# Patient Record
Sex: Male | Born: 1935 | Race: White | Hispanic: No | Marital: Married | Smoking: Never smoker
Health system: Southern US, Community
[De-identification: ages and names within clinical notes are randomized; demographics above are authoritative.]

## PROBLEM LIST (undated history)

## (undated) DIAGNOSIS — R972 Elevated prostate specific antigen [PSA]: Secondary | ICD-10-CM

## (undated) DIAGNOSIS — N419 Inflammatory disease of prostate, unspecified: Secondary | ICD-10-CM

## (undated) DIAGNOSIS — R945 Abnormal results of liver function studies: Secondary | ICD-10-CM

## (undated) DIAGNOSIS — M1990 Unspecified osteoarthritis, unspecified site: Secondary | ICD-10-CM

## (undated) DIAGNOSIS — E119 Type 2 diabetes mellitus without complications: Secondary | ICD-10-CM

## (undated) DIAGNOSIS — M1712 Unilateral primary osteoarthritis, left knee: Secondary | ICD-10-CM

## (undated) DIAGNOSIS — G459 Transient cerebral ischemic attack, unspecified: Secondary | ICD-10-CM

## (undated) DIAGNOSIS — K648 Other hemorrhoids: Secondary | ICD-10-CM

## (undated) DIAGNOSIS — I639 Cerebral infarction, unspecified: Secondary | ICD-10-CM

## (undated) DIAGNOSIS — I493 Ventricular premature depolarization: Secondary | ICD-10-CM

## (undated) DIAGNOSIS — K2970 Gastritis, unspecified, without bleeding: Secondary | ICD-10-CM

## (undated) DIAGNOSIS — I350 Nonrheumatic aortic (valve) stenosis: Secondary | ICD-10-CM

## (undated) DIAGNOSIS — E291 Testicular hypofunction: Secondary | ICD-10-CM

## (undated) DIAGNOSIS — K219 Gastro-esophageal reflux disease without esophagitis: Secondary | ICD-10-CM

## (undated) DIAGNOSIS — K2990 Gastroduodenitis, unspecified, without bleeding: Secondary | ICD-10-CM

## (undated) DIAGNOSIS — D126 Benign neoplasm of colon, unspecified: Secondary | ICD-10-CM

## (undated) DIAGNOSIS — N289 Disorder of kidney and ureter, unspecified: Secondary | ICD-10-CM

## (undated) DIAGNOSIS — R7989 Other specified abnormal findings of blood chemistry: Secondary | ICD-10-CM

## (undated) DIAGNOSIS — Q249 Congenital malformation of heart, unspecified: Secondary | ICD-10-CM

## (undated) DIAGNOSIS — D649 Anemia, unspecified: Secondary | ICD-10-CM

## (undated) DIAGNOSIS — C801 Malignant (primary) neoplasm, unspecified: Secondary | ICD-10-CM

## (undated) DIAGNOSIS — E785 Hyperlipidemia, unspecified: Secondary | ICD-10-CM

## (undated) DIAGNOSIS — B159 Hepatitis A without hepatic coma: Secondary | ICD-10-CM

## (undated) DIAGNOSIS — R0989 Other specified symptoms and signs involving the circulatory and respiratory systems: Secondary | ICD-10-CM

## (undated) DIAGNOSIS — N189 Chronic kidney disease, unspecified: Secondary | ICD-10-CM

## (undated) DIAGNOSIS — K5730 Diverticulosis of large intestine without perforation or abscess without bleeding: Secondary | ICD-10-CM

## (undated) DIAGNOSIS — I10 Essential (primary) hypertension: Secondary | ICD-10-CM

## (undated) HISTORY — PX: INGUINAL HERNIA REPAIR: SHX194

## (undated) HISTORY — DX: Other specified symptoms and signs involving the circulatory and respiratory systems: R09.89

## (undated) HISTORY — DX: Hyperlipidemia, unspecified: E78.5

## (undated) HISTORY — DX: Essential (primary) hypertension: I10

## (undated) HISTORY — DX: Congenital malformation of heart, unspecified: Q24.9

## (undated) HISTORY — DX: Nonrheumatic aortic (valve) stenosis: I35.0

## (undated) HISTORY — DX: Gastritis, unspecified, without bleeding: K29.70

## (undated) HISTORY — PX: CATARACT EXTRACTION W/ INTRAOCULAR LENS  IMPLANT, BILATERAL: SHX1307

## (undated) HISTORY — DX: Ventricular premature depolarization: I49.3

## (undated) HISTORY — PX: COLONOSCOPY: SHX174

## (undated) HISTORY — DX: Anemia, unspecified: D64.9

## (undated) HISTORY — DX: Testicular hypofunction: E29.1

## (undated) HISTORY — DX: Other hemorrhoids: K64.8

## (undated) HISTORY — DX: Inflammatory disease of prostate, unspecified: N41.9

## (undated) HISTORY — DX: Disorder of kidney and ureter, unspecified: N28.9

## (undated) HISTORY — DX: Diverticulosis of large intestine without perforation or abscess without bleeding: K57.30

## (undated) HISTORY — DX: Other specified abnormal findings of blood chemistry: R79.89

## (undated) HISTORY — DX: Type 2 diabetes mellitus without complications: E11.9

## (undated) HISTORY — DX: Cerebral infarction, unspecified: I63.9

## (undated) HISTORY — DX: Unilateral primary osteoarthritis, left knee: M17.12

## (undated) HISTORY — DX: Gastro-esophageal reflux disease without esophagitis: K21.9

## (undated) HISTORY — DX: Benign neoplasm of colon, unspecified: D12.6

## (undated) HISTORY — DX: Gastroduodenitis, unspecified, without bleeding: K29.90

## (undated) HISTORY — DX: Transient cerebral ischemic attack, unspecified: G45.9

## (undated) HISTORY — DX: Chronic kidney disease, unspecified: N18.9

## (undated) HISTORY — DX: Abnormal results of liver function studies: R94.5

## (undated) HISTORY — DX: Unspecified osteoarthritis, unspecified site: M19.90

## (undated) HISTORY — DX: Hepatitis a without hepatic coma: B15.9

## (undated) HISTORY — PX: ANTERIOR FUSION LUMBAR SPINE: SUR629

## (undated) HISTORY — DX: Elevated prostate specific antigen (PSA): R97.20

---
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
## 1999-05-01 ENCOUNTER — Encounter (HOSPITAL_BASED_OUTPATIENT_CLINIC_OR_DEPARTMENT_OTHER): Payer: Self-pay | Admitting: General Surgery

## 1999-05-04 ENCOUNTER — Ambulatory Visit (HOSPITAL_COMMUNITY): Admission: RE | Admit: 1999-05-04 | Discharge: 1999-05-04 | Payer: Self-pay | Admitting: General Surgery

## 2001-08-18 ENCOUNTER — Encounter: Admission: RE | Admit: 2001-08-18 | Discharge: 2001-08-18 | Payer: Self-pay | Admitting: Emergency Medicine

## 2001-08-18 ENCOUNTER — Encounter: Payer: Self-pay | Admitting: Emergency Medicine

## 2001-08-26 ENCOUNTER — Encounter: Payer: Self-pay | Admitting: Emergency Medicine

## 2001-08-26 ENCOUNTER — Inpatient Hospital Stay (HOSPITAL_COMMUNITY): Admission: EM | Admit: 2001-08-26 | Discharge: 2001-09-04 | Payer: Self-pay | Admitting: Emergency Medicine

## 2001-08-28 ENCOUNTER — Encounter: Payer: Self-pay | Admitting: Internal Medicine

## 2001-11-02 ENCOUNTER — Encounter: Payer: Self-pay | Admitting: Emergency Medicine

## 2001-11-02 ENCOUNTER — Ambulatory Visit (HOSPITAL_COMMUNITY): Admission: RE | Admit: 2001-11-02 | Discharge: 2001-11-02 | Payer: Self-pay | Admitting: Emergency Medicine

## 2001-11-15 ENCOUNTER — Encounter: Admission: RE | Admit: 2001-11-15 | Discharge: 2001-11-15 | Payer: Self-pay | Admitting: Infectious Diseases

## 2001-11-30 ENCOUNTER — Encounter: Payer: Self-pay | Admitting: Infectious Diseases

## 2001-11-30 ENCOUNTER — Ambulatory Visit (HOSPITAL_COMMUNITY): Admission: RE | Admit: 2001-11-30 | Discharge: 2001-11-30 | Payer: Self-pay | Admitting: Infectious Diseases

## 2001-12-27 ENCOUNTER — Encounter: Admission: RE | Admit: 2001-12-27 | Discharge: 2001-12-27 | Payer: Self-pay | Admitting: Infectious Diseases

## 2002-06-02 ENCOUNTER — Encounter: Payer: Self-pay | Admitting: Emergency Medicine

## 2002-06-02 ENCOUNTER — Emergency Department (HOSPITAL_COMMUNITY): Admission: EM | Admit: 2002-06-02 | Discharge: 2002-06-02 | Payer: Self-pay

## 2004-08-11 ENCOUNTER — Encounter: Admission: RE | Admit: 2004-08-11 | Discharge: 2004-08-11 | Payer: Self-pay | Admitting: Family Medicine

## 2007-09-14 DIAGNOSIS — I639 Cerebral infarction, unspecified: Secondary | ICD-10-CM

## 2007-09-14 HISTORY — DX: Cerebral infarction, unspecified: I63.9

## 2007-12-21 ENCOUNTER — Encounter: Admission: RE | Admit: 2007-12-21 | Discharge: 2007-12-21 | Payer: Self-pay | Admitting: Family Medicine

## 2010-03-23 ENCOUNTER — Encounter: Admission: RE | Admit: 2010-03-23 | Discharge: 2010-03-23 | Payer: Self-pay | Admitting: Family Medicine

## 2010-06-10 ENCOUNTER — Encounter: Admission: RE | Admit: 2010-06-10 | Discharge: 2010-06-10 | Payer: Self-pay | Admitting: Family Medicine

## 2010-07-22 ENCOUNTER — Encounter
Admission: RE | Admit: 2010-07-22 | Discharge: 2010-07-22 | Payer: Self-pay | Source: Home / Self Care | Attending: Family Medicine | Admitting: Family Medicine

## 2011-01-29 NOTE — Discharge Summary (Signed)
[facility]  Patient:    Eduardo Hubbard, Eduardo Hubbard Visit Number: [id number] MRN: [id number]          Service Type: MED Location: [phone] Attending Physician:  Roque Lias Dictated by:   Reuben Likes, M.D. Admit Date:  08/26/2001 Discharge Date: 09/04/2001   CC:         Carolyne Fiscal, M.D.   Discharge Summary  HISTORY OF PRESENT ILLNESS:  The patient is a 77 year old male with a history of diabetes, treated with Glucophage, and also a history of hypertension and hyperlipidemia, and an infection to the left third toe which has been treated with outpatient I&D and oral antibiotics.  He was initially seen on 08/18/01, and was started on Indocin for the possibility of gout, Cipro for an urinary tract infection, and Keflex.  Plain x-ray at that time showed no evidence of osteomyelitis.  The patient noted today the toe seemed worse.  He had some pain last evening, and had a low grade fever of 100.6.  He is admitted now for IV antibiotics.  PHYSICAL EXAMINATION:  VITAL SIGNS:  Temperature 98.2, blood pressure 164/90, pulse 60 and regular.  SKIN:  Warm and dry.  NECK:  There was no adenopathy.  Supple.  There were no carotid bruits, no thyroid enlargement.  HEENT:  Sclerae and conjunctivae were clear.  Tympanic membranes were normal. Pharynx was clear.  LUNGS:  Clear.  CARDIAC:  Rhythm was regular with normal S1 and S2, and no gallops.  ABDOMEN:  Bowel sounds were active.  He had no hepatosplenomegaly, masses, or abdominal tenderness.  EXTREMITIES:  No lower extremity edema.  The left third toe was swollen and red and tender to touch.  ADMITTING IMPRESSION:  Infected left third toe, rule out osteomyelitis.  LABORATORY DATA:  Hemoglobin on admission was 13.9, white count 8000, sedimentation rate 37.  Sodium 137, potassium 4.5, glucose 114, BUN 24, creatinine 1.4.  Uric acid was elevated at 9.0.  Hemoglobin A1C was 6.3. Urinalysis  was negative.  Blood cultures x2 were negative.  An EKG showed sinus bradycardia, otherwise normal EKG.  Plain films of the left foot showed soft tissue swelling of the third digit, no definite evidence of osteomyelitis, and there was a question of gouty arthritis.  Chest x-ray showed no active disease.  A bone scan showed increased activity in the left third toe, thought most likely to represent osteomyelitis.  There were other areas of increased activity, thought most likely to be related to degenerative changes.  HOSPITAL COURSE:  The patient was hospitalized on the 400 floor at [facility].  He was given a 4 g sodium, 2000 calorie ADA diet.  He was allowed to be up ad lib.  He was given IV Cipro 400 mg IV q.12h., and IV Rocephin 1 g initially, then 1 g IV q.24h.  He was continued on his home medications which included atenolol 50 mg q.d., Glucophage 850 mg t.i.d., Zocor 10 mg q.d., Zestril 10 mg q.d., and he was given oxycodone for pain.  Accu-Cheks were obtained q.i.d., and consultations were obtained with Dr. Simonne Come, and also an infectious disease consult was obtained from Dr. Ninetta Lights.  Over the next several days the toe became less swollen and less painful.  He remained afebrile throughout the remainder of his hospital stay.  His antibiotic was switched to Unasyn, and Cipro was continued p.o. for better coverage.  Dr. Ninetta Lights recommended continuing the Unasyn and Cipro for one week  in the hospital, then for 8 weeks as an outpatient by mouth.  The remainder of his hospital course was uneventful.  He did not have any further pain in the toe, and swelling was down a little bit, although even at the time of discharge it was somewhat reddened and swollen.  As of 09/04/01, he had no toe pain or drainage from the toe.  His temperature was 99.5.  Lungs were clear.  Cardiac rhythm was regular without murmur or gallop.  Abdomen was soft and nontender.  Toe was minimally  tender and red without swelling, and pedal pulses were full.  His blood sugars were under good control, which they had been throughout his hospital stay.  He was discharged on that date in improved condition.  FINAL DIAGNOSES: 1. Osteomyelitis left third toe. 2. Type 2 diabetes. 3. Hypertension. 4. Hyperlipidemia. 5. History of gout.  DISCHARGE MEDICATIONS: 1. Augmentin 500 mg t.i.d. with food. 2. Cipro 500 mg b.i.d. on an empty stomach. 3. Atenolol 50 mg one q.d. 4. Metformin 850 mg t.i.d. 5. Zocor 10 mg q.d. 6. Prinivil 10 mg q.d. 7. Allopurinol 300 mg q.d.  ACTIVITY:  Light activity around the house.  He was to elevate his leg as much as possible.  No driving or walking for the next 2 weeks, and no heavy work.  DIET:  No sugar or concentrated sweets.  WOUND CARE:  Should apply gauze dressing to toe daily.  FOLLOWUP:  He is to follow up at our office in two weeks. Dictated by:   Reuben Likes, M.D. Attending Physician:  Roque Lias DD:  10/18/01 TD:  10/19/01 Job: [id number]

## 2011-01-29 NOTE — H&P (Signed)
[facility]  Patient:    Eduardo Hubbard, Eduardo Hubbard Visit Number: [id number] MRN: [id number]          Service Type: MED Location: [phone] Attending Physician:  Roque Lias Dictated by:   Luanna Cole. Lenord Fellers, M.D. Admit Date:  08/26/2001   CC:         Fayrene Fearing P. Aplington, M.D.  Carolyne Fiscal, M.D.   History and Physical  CHIEF COMPLAINT:  "Infected toe."  HISTORY OF PRESENT ILLNESS:  This 77 year old white male patient of Dr. Mosetta Putt with history of adult onset diabetes mellitus treated with Glucophage, history of hypertension, hyperlipidemia, and severe infection in the left third toe which was not responded to an outpatient incision and drainage and oral antibiotics after some eight days.  He was initially seen 08/18/01 and was started on Indocin, Cipro, and Keflex.  Plain x-ray at that time showed no osteomyelitis.  His initial diagnosis was gout according to his wife, who is a Engineer, civil (consulting).  The patient returned early this week and was seen by Dr. Leslee Home in the office.  The toe was incised and drained.  The patients wife called today saying that the toe seemed worse.  He had some pain last evening.  He has a low-grade fever of approximately 100.6 degrees orally on December 11, after receiving a tetanus shot, at which time the incision and drainage was done.  He is now admitted for IV antibiotics and an orthopedic consultation.  We need to rule out osteomyelitis.  PAST MEDICAL HISTORY: 1. No known drug allergies. 2. History of bilateral inguinal hernia repairs, one done about 18 years ago    and the other one about two years ago, both by Dr. Leonie Man. 3. History of adult onset diabetes mellitus, medication-treated for the past    6-7 years.  Control has been very acceptable according to the patient. 4. The patient had tetanus immunization 08/23/01. 5. History of hyperlipidemia and hypertension which he describes mainly as  office hypertension.  MEDICATIONS: 1. Tenormin 50 mg daily. 2. Glucophage 850 mg t.i.d. 3. Cipro 500 m g b.i.d. 4. Keflex 500 mg q.i.d. 5. Zocor 10 mg daily. 6. Oxycodone APAP q.4-6h. p.r.n. pain. 7. Vitamin E. 8. The patient previously was on Indocin, but that has been held since    December 11.  SOCIAL HISTORY:  He is retired.  He formerly worked as a Aeronautical engineer for [employer] and formerly worked with other [employer] including [employer] and [place].  He has two adult children, a son age 41, and a male age 33.  He is married, does not smoke or consume alcohol.  FAMILY HISTORY:  Mother died with COPD.  Father died with complications of adult onset diabetes mellitus.  He has one sister and four brothers, all of whom all described as in fairly good health.  The patient has a history of irregular heartbeat treated with Tenormin, history of prostatitis.  PHYSICAL EXAMINATION:  VITAL SIGNS:  Temperature 98.2 degrees orally.  Blood pressure 164/90, pulse 60 regular.  SKIN:  Warm and dry.  NODES:  None.  HEENT:  Head is normocephalic, atraumatic.  Sclerae and conjunctivae are clear.  TMs are clear.  Pharynx is clear.  NECK:  Supple without carotid bruits or thyromegaly.  CHEST:  Clear to auscultation.  CARDIAC:  Regular rate and rhythm, normal S1 and S2 without gallops.  ABDOMEN:  Bowel sounds active.  No hepatosplenomegaly, masses, or tenderness.  EXTREMITIES:  No lower extremity edema.  IMPRESSION:  Infected left third toe - rule out osteomyelitis in a 77 year old white male with adult onset diabetes mellitus.  PLAN:  The patient will be placed on intravenous Cipro and intravenous Rocephin after blood cultures are obtained.  A limited bone scan will be obtained as well as a repeat plane film of the left foot.  Orthopedic consultation will be obtained, and I have spoken with Dr. Simonne Come personally, who will see him later  today. Dictated by:   Luanna Cole. Lenord Fellers, M.D. Attending Physician:  Roque Lias DD:  08/28/99 TD:  08/27/01 Job: [phone] [id number]

## 2011-05-06 ENCOUNTER — Other Ambulatory Visit: Payer: Self-pay | Admitting: Family Medicine

## 2011-05-06 DIAGNOSIS — R0989 Other specified symptoms and signs involving the circulatory and respiratory systems: Secondary | ICD-10-CM

## 2011-05-07 ENCOUNTER — Ambulatory Visit
Admission: RE | Admit: 2011-05-07 | Discharge: 2011-05-07 | Disposition: A | Discharge status: Home / Self Care | Payer: Medicare Other | Source: Ambulatory Visit | Attending: Family Medicine | Admitting: Family Medicine

## 2011-05-07 DIAGNOSIS — R0989 Other specified symptoms and signs involving the circulatory and respiratory systems: Secondary | ICD-10-CM

## 2011-05-10 ENCOUNTER — Encounter: Payer: Self-pay | Admitting: Gastroenterology

## 2011-05-26 ENCOUNTER — Encounter: Payer: Self-pay | Admitting: *Deleted

## 2011-05-31 ENCOUNTER — Encounter: Payer: Self-pay | Admitting: Gastroenterology

## 2011-05-31 ENCOUNTER — Ambulatory Visit (INDEPENDENT_AMBULATORY_CARE_PROVIDER_SITE_OTHER): Payer: Medicare Other | Admitting: Gastroenterology

## 2011-05-31 VITALS — BP 140/72 | HR 60 | Ht 71.0 in | Wt 205.8 lb

## 2011-05-31 DIAGNOSIS — Z1211 Encounter for screening for malignant neoplasm of colon: Secondary | ICD-10-CM

## 2011-05-31 DIAGNOSIS — K219 Gastro-esophageal reflux disease without esophagitis: Secondary | ICD-10-CM

## 2011-05-31 DIAGNOSIS — Z8673 Personal history of transient ischemic attack (TIA), and cerebral infarction without residual deficits: Secondary | ICD-10-CM | POA: Insufficient documentation

## 2011-05-31 MED ORDER — PEG-KCL-NACL-NASULF-NA ASC-C 100 G PO SOLR: 1.0000 | Freq: Once | ORAL | Status: DC

## 2011-05-31 NOTE — Progress Notes (Signed)
History of Present Illness: This is an 77 year old male who presents for colorectal cancer screening. He has mild reflux symptoms that are well controlled with as needed omeprazole. He last underwent colonoscopy in October 2003 for evaluation of iron deficiency anemia which showed only diverticulosis. He recently had a complete physical exam by Dr. Cora Daniels in including blood work. I have reviewed those records. He had a mild CVA in 2009 and is maintained on aspirin and Persantine. He had stable diabetes type 2, hypertension, hyperlipidemia, renal insufficiency and PVCs. Overall his health status is stable and he is interested in proceeding with colorectal cancer screening. Denies weight loss, abdominal pain, constipation, diarrhea, change in stool caliber, melena, hematochezia, nausea, vomiting, dysphagia, chest pain.  Past Medical History  Diagnosis Date  . Type II or unspecified type diabetes mellitus without mention of complication, not stated as uncontrolled   . Hypertension   . Hyperlipemia   . Prostatitis   . PVC (premature ventricular contraction)   . Unspecified gastritis and gastroduodenitis without mention of hemorrhage   . Anemia   . Diverticulosis of colon (without mention of hemorrhage)   . Internal hemorrhoids without mention of complication   . Hepatitis A 1953  . Gout   . Hypogonadism male   . Aortic stenosis   . Esophageal reflux   . Renal insufficiency   . Degenerative arthritis of left knee   . Elevated prostate specific antigen (PSA)   . Other symptoms involving cardiovascular system   . Cardiac arrhythmia due to congenital heart disease   . Arthritis     Left Knee  . Stroke 2009   Past Surgical History  Procedure Date  . Inguinal hernia repair     bilateral  . Cataract extraction w/ intraocular lens  implant, bilateral 2011    reports that he has never smoked. He has never used smokeless tobacco. He reports that he does not drink alcohol or use illicit  drugs. family history includes COPD in his mother and sister; Diabetes in his father and son; Heart disease in his father; and Throat cancer in his brother. Allergies not on file  Outpatient Encounter Prescriptions as of 05/31/2011  Medication Sig Dispense Refill  . allopurinol (ZYLOPRIM) 300 MG tablet Take 300 mg by mouth daily.        Marland Kitchen amLODipine (NORVASC) 5 MG tablet Take 5 mg by mouth daily.        Marland Kitchen aspirin 81 MG tablet Take 81 mg by mouth daily.        Marland Kitchen atenolol (TENORMIN) 50 MG tablet Take 50 mg by mouth daily.        Marland Kitchen CALCIUM-VITAMIN D PO Take by mouth 2 (two) times daily.        . cholecalciferol (VITAMIN D) 1000 UNITS tablet Take 1,000 Units by mouth daily.        Marland Kitchen dipyridamole (PERSANTINE) 50 MG tablet Take 100 mg by mouth 4 (four) times daily.        Marland Kitchen glyBURIDE (DIABETA) 2.5 MG tablet Take 2.5 mg by mouth daily with breakfast.        . losartan (COZAAR) 100 MG tablet Take 100 mg by mouth daily.        . Multiple Vitamins-Minerals (PRESERVISION AREDS 2 PO) Take by mouth 2 (two) times daily.        Marland Kitchen omeprazole (PRILOSEC) 20 MG capsule Take 20 mg by mouth daily. As needed        . pravastatin (PRAVACHOL) 80 MG  tablet Take 80 mg by mouth daily.        . sitaGLIPtin (JANUVIA) 50 MG tablet Take 50 mg by mouth daily.        . peg 3350 powder (MOVIPREP) 100 G SOLR Take 1 kit (100 g total) by mouth once.  1 kit  0  . DISCONTD: atorvastatin (LIPITOR) 40 MG tablet Take 40 mg by mouth daily.         Review of Systems: Pertinent positive and negative review of systems were noted in the above HPI section. All other review of systems were otherwise negative.  Physical Exam: General: Well developed , well nourished, no acute distress Head: Normocephalic and atraumatic Eyes:  sclerae anicteric, EOMI Ears: Normal auditory acuity Mouth: No deformity or lesions Neck: Supple, no masses or thyromegaly Lungs: Clear throughout to auscultation Heart: Regular rate and rhythm; 2/6 systolic  murmur heard all across the precordium, no rubs or bruits Abdomen: Soft, non tender and non distended. No masses, hepatosplenomegaly or hernias noted. Normal Bowel sounds Rectal: Deferred to colonoscopy Musculoskeletal: Symmetrical with no gross deformities  Skin: No lesions on visible extremities Pulses:  Normal pulses noted Extremities: No clubbing, cyanosis, edema or deformities noted Neurological: Alert oriented x 4, grossly nonfocal Cervical Nodes:  No significant cervical adenopathy Inguinal Nodes: No significant inguinal adenopathy Psychological:  Alert and cooperative. Normal mood and affect  Assessment and Recommendations:  1. Colorectal cancer screening. Average risk. Despite his age and comorbidities his overall health status appears to be quite stable and is interested in proceeding with colorectal cancer screening. The risks, benefits, and alternatives to colonoscopy with possible biopsy and possible polypectomy were discussed with the patient and they consent to proceed.   2. History of CVA in 2009. Plan for 5-7 day hold of Persantine prior to colonoscopy. He will remain on a daily aspirin. Will confirm this plan with Dr. Cora Daniels.  3. GERD. Continue standard antireflux measures and omeprazole 20 mg daily as needed.  4. Diverticulosis. Long-term high-fiber diet and adequate daily water intake.

## 2011-05-31 NOTE — Patient Instructions (Signed)
You have been scheduled for a Colonoscopy. See separate instructions.  Pick up your prep kit from your pharmacy.  cc: Mosetta Putt, MD

## 2011-06-01 ENCOUNTER — Encounter: Payer: Self-pay | Admitting: Gastroenterology

## 2011-06-04 ENCOUNTER — Telehealth: Payer: Self-pay

## 2011-06-04 NOTE — Telephone Encounter (Signed)
Pt informed per Dr. Duaine Dredge to hold dipyridamole 5 days prior to his scheduled procedure. Pt agreed and verbalized understanding.

## 2011-06-30 ENCOUNTER — Ambulatory Visit (AMBULATORY_SURGERY_CENTER): Payer: Medicare Other | Admitting: Gastroenterology

## 2011-06-30 ENCOUNTER — Encounter: Payer: Self-pay | Admitting: Gastroenterology

## 2011-06-30 VITALS — BP 146/72 | HR 52 | Temp 97.4°F | Resp 20 | Ht 71.0 in | Wt 205.0 lb

## 2011-06-30 DIAGNOSIS — D126 Benign neoplasm of colon, unspecified: Secondary | ICD-10-CM

## 2011-06-30 DIAGNOSIS — Z1211 Encounter for screening for malignant neoplasm of colon: Secondary | ICD-10-CM

## 2011-06-30 LAB — GLUCOSE, CAPILLARY: Glucose-Capillary: 121 mg/dL — ABNORMAL HIGH (ref 70–99)

## 2011-06-30 MED ORDER — SODIUM CHLORIDE 0.9 % IV SOLN: 500.0000 mL | INTRAVENOUS | Status: DC

## 2011-06-30 NOTE — Patient Instructions (Addendum)
Please refer to your blue and neon green sheets for instructions regarding diet and activity for the rest of today.  You may resume your medications as you would normally take them.   HOLD ALL ASPIRIN AND ASPIRIN CONTAINING PRODUCTS FOR 2 WEEKS (07/14/2011-HALLOWEEN)  Hemorrhoids Hemorrhoids are dilated (enlarged) veins around the rectum. Sometimes clots will form in the veins. This makes them swollen and painful. These are called thrombosed hemorrhoids. Causes of hemorrhoids include:  Pregnancy: this increases the pressure in the hemorrhoidal veins.   Constipation.   Straining to have a bowel movement.  HOME CARE INSTRUCTIONS  Eat a well balanced diet and drink 6 to 8 glasses of water every day to avoid constipation. You may also use a bulk laxative.   Avoid straining to have bowel movements.   Keep anal area dry and clean.   Only take over-the-counter or prescription medicines for pain, discomfort, or fever as directed by your caregiver.  If thrombosed:  Take hot sitz baths for 20 to 30 minutes, 3 to 4 times per day.   If the hemorrhoids are very tender and swollen, place ice packs on area as tolerated. Using ice packs between sitz baths may be helpful. Fill a plastic bag with ice and use a towel between the bag of ice and your skin.   Special creams and suppositories (Anusol, Nupercainal, Wyanoids) may be used or applied as directed.   Do not use a donut shaped pillow or sit on the toilet for long periods. This increases blood pooling and pain.   Move your bowels when your body has the urge; this will require less straining and will decrease pain and pressure.   Only take over-the-counter or prescription medicines for pain, discomfort, or fever as directed by your caregiver.  SEEK MEDICAL CARE IF:  You have increasing pain and swelling that is not controlled with your prescription.   You have uncontrolled bleeding.   You have an inability or difficulty having a bowel  movement.   You have pain or inflammation outside the area of the hemorrhoids.   You have chills and/or an oral temperature above 101.3 that lasts for 2 days or longer, or as your caregiver suggests.  MAKE SURE YOU:   Understand these instructions.   Will watch your condition.   Will get help right away if you are not doing well or get worse.  Document Released: 08/27/2000 Document Re-Released: 08/12/2008 [facility] Patient Information 2011 [place], [place].  Diverticulosis Diverticulosis is a common condition that develops when small pouches (diverticula) form in the wall of the colon. The risk of diverticulosis increases with age. It happens more often in people who eat a low-fiber diet. Most individuals with diverticulosis have no symptoms. Those individuals with symptoms usually experience belly (abdominal) pain, constipation, or loose stools (diarrhea). HOME CARE INSTRUCTIONS  Increase the amount of fiber in your diet as directed by your caregiver or dietician. This may reduce symptoms of diverticulosis.   Your caregiver may recommend taking a dietary fiber supplement.   Drink at least 6 to 8 glasses of water each day to prevent constipation.   Try not to strain when you have a bowel movement.   Your caregiver may recommend avoiding nuts and seeds to prevent complications, although this is still an uncertain benefit.   Only take over-the-counter or prescription medicines for pain, discomfort, or fever as directed by your caregiver.  FOODS HAVING HIGH FIBER CONTENT INCLUDE:  Fruits. Apple, peach, pear, tangerine, raisins, prunes.  Vegetables. Brussels sprouts, asparagus, broccoli, cabbage, carrot, cauliflower, romaine lettuce, spinach, summer squash, tomato, winter squash, zucchini.   Starchy Vegetables. Baked beans, kidney beans, lima beans, split peas, lentils, potatoes (with skin).   Grains. Whole wheat bread, brown rice, bran flake cereal, plain oatmeal, white rice,  shredded wheat, bran muffins.  SEEK IMMEDIATE MEDICAL CARE IF:  You develop increasing pain or severe bloating.   You have an oral temperature above 101.3, not controlled by medicine.   You develop vomiting or bowel movements that are bloody or black.  Document Released: 05/27/2004 Document Re-Released: 02/17/2010 [facility] Patient Information 2011 [place], [place].  Polyps, Colon  A polyp is extra tissue that grows inside your body. Colon polyps grow in the large intestine. The large intestine, also called the colon, is part of your digestive system. It is a long, hollow tube at the end of your digestive tract where your body makes and stores stool. Most polyps are not dangerous. They are benign. This means they are not cancerous. But over time, some types of polyps can turn into cancer. Polyps that are smaller than a pea are usually not harmful. But larger polyps could someday become or may already be cancerous. To be safe, doctors remove all polyps and test them.  WHO GETS POLYPS? Anyone can get polyps, but certain people are more likely than others. You may have a greater chance of getting polyps if:  You are over 50.   You have had polyps before.   Someone in your family has had polyps.   Someone in your family has had cancer of the large intestine.   Find out if someone in your family has had polyps. You may also be more likely to get polyps if you:   Eat a lot of fatty foods   Smoke   Drink alcohol   Do not exercise  Eat too much  SYMPTOMS Most small polyps do not cause symptoms. People often do not know they have one until their caregiver finds it during a regular checkup or while testing them for something else. Some people do have symptoms like these:  Bleeding from the anus. You might notice blood on your underwear or on toilet paper after you have had a bowel movement.   Constipation or diarrhea that lasts more than a week.   Blood in the stool. Blood can make stool  look black or it can show up as red streaks in the stool.  If you have any of these symptoms, see your caregiver. HOW DOES THE DOCTOR TEST FOR POLYPS? The doctor can use four tests to check for polyps:  Digital rectal exam. The caregiver wears gloves and checks your rectum (the last part of the large intestine) to see if it feels normal. This test would find polyps only in the rectum. Your caregiver may need to do one of the other tests listed below to find polyps higher up in the intestine.   Barium enema. The caregiver puts a liquid called barium into your rectum before taking x-rays of your large intestine. Barium makes your intestine look white in the pictures. Polyps are dark, so they are easy to see.   Sigmoidoscopy. With this test, the caregiver can see inside your large intestine. A thin flexible tube is placed into your rectum. The device is called a sigmoidoscope, which has a light and a tiny video camera in it. The caregiver uses the sigmoidoscope to look at the last third of your large intestine.  Colonoscopy. This test is like sigmoidoscopy, but the caregiver looks at all of the large intestine. It usually requires sedation. This is the most common method for finding and removing polyps.  TREATMENT  The caregiver will remove the polyp during sigmoidoscopy or colonoscopy. The polyp is then tested for cancer.   If you have had polyps, your caregiver may want you to get tested regularly in the future.  PREVENTION There is not one sure way to prevent polyps. You might be able to lower your risk of getting them if you:  Eat more fruits and vegetables and less fatty food.   Do not smoke.   Avoid alcohol.   Exercise every day.   Lose weight if you are overweight.   Eating more calcium and folate can also lower your risk of getting polyps. Some foods that are rich in calcium are milk, cheese, and broccoli. Some foods that are rich in folate are chickpeas, kidney beans, and  spinach.   Aspirin might help prevent polyps. Studies are under way.  Document Released: 05/26/2004 Document Re-Released: 02/17/2010 [facility] Patient Information 2011 [place], [place].

## 2011-07-01 ENCOUNTER — Telehealth: Payer: Self-pay | Admitting: *Deleted

## 2011-07-01 NOTE — Telephone Encounter (Signed)

## 2011-07-05 ENCOUNTER — Encounter: Payer: Self-pay | Admitting: Gastroenterology

## 2012-03-23 ENCOUNTER — Ambulatory Visit
Admission: RE | Admit: 2012-03-23 | Discharge: 2012-03-23 | Disposition: A | Discharge status: Home / Self Care | Payer: Medicare Other | Source: Ambulatory Visit | Attending: Family Medicine | Admitting: Family Medicine

## 2012-03-23 ENCOUNTER — Other Ambulatory Visit: Payer: Self-pay | Admitting: Family Medicine

## 2012-03-23 DIAGNOSIS — M545 Low back pain: Secondary | ICD-10-CM

## 2012-04-13 ENCOUNTER — Other Ambulatory Visit: Payer: Self-pay | Admitting: Family Medicine

## 2012-04-13 DIAGNOSIS — M545 Low back pain: Secondary | ICD-10-CM

## 2012-04-18 ENCOUNTER — Ambulatory Visit
Admission: RE | Admit: 2012-04-18 | Discharge: 2012-04-18 | Disposition: A | Discharge status: Home / Self Care | Payer: Medicare Other | Source: Ambulatory Visit | Attending: Family Medicine | Admitting: Family Medicine

## 2012-04-18 DIAGNOSIS — M545 Low back pain, unspecified: Secondary | ICD-10-CM

## 2012-04-19 ENCOUNTER — Other Ambulatory Visit: Payer: Self-pay | Admitting: Family Medicine

## 2012-04-19 DIAGNOSIS — M4800 Spinal stenosis, site unspecified: Secondary | ICD-10-CM

## 2012-04-25 ENCOUNTER — Ambulatory Visit
Admission: RE | Admit: 2012-04-25 | Discharge: 2012-04-25 | Disposition: A | Discharge status: Home / Self Care | Payer: Medicare Other | Source: Ambulatory Visit | Attending: Family Medicine | Admitting: Family Medicine

## 2012-04-25 DIAGNOSIS — M4800 Spinal stenosis, site unspecified: Secondary | ICD-10-CM

## 2012-04-25 MED ORDER — IOHEXOL 180 MG/ML  SOLN
1.0000 mL | Freq: Once | INTRAMUSCULAR | Status: AC | PRN
  Administered 2012-04-25: 1 mL via EPIDURAL

## 2012-04-25 MED ORDER — METHYLPREDNISOLONE ACETATE 40 MG/ML INJ SUSP (RADIOLOG
120.0000 mg | Freq: Once | INTRAMUSCULAR | Status: AC
  Administered 2012-04-25: 120 mg via EPIDURAL

## 2012-05-11 HISTORY — PX: OTHER SURGICAL HISTORY: SHX169

## 2012-05-12 ENCOUNTER — Ambulatory Visit (INDEPENDENT_AMBULATORY_CARE_PROVIDER_SITE_OTHER): Payer: Medicare Other | Admitting: *Deleted

## 2012-05-12 DIAGNOSIS — R0989 Other specified symptoms and signs involving the circulatory and respiratory systems: Secondary | ICD-10-CM

## 2012-06-12 ENCOUNTER — Other Ambulatory Visit: Payer: Self-pay | Admitting: Family Medicine

## 2012-06-12 DIAGNOSIS — M549 Dorsalgia, unspecified: Secondary | ICD-10-CM

## 2012-06-16 ENCOUNTER — Ambulatory Visit
Admission: RE | Admit: 2012-06-16 | Discharge: 2012-06-16 | Disposition: A | Discharge status: Home / Self Care | Payer: Medicare Other | Source: Ambulatory Visit | Attending: Family Medicine | Admitting: Family Medicine

## 2012-06-16 DIAGNOSIS — M549 Dorsalgia, unspecified: Secondary | ICD-10-CM

## 2012-06-16 MED ORDER — METHYLPREDNISOLONE ACETATE 40 MG/ML INJ SUSP (RADIOLOG
120.0000 mg | Freq: Once | INTRAMUSCULAR | Status: AC
  Administered 2012-06-16: 120 mg via EPIDURAL

## 2012-06-16 MED ORDER — IOHEXOL 180 MG/ML  SOLN
1.0000 mL | Freq: Once | INTRAMUSCULAR | Status: AC | PRN
  Administered 2012-06-16: 1 mL via EPIDURAL

## 2012-07-14 ENCOUNTER — Other Ambulatory Visit: Payer: Self-pay | Admitting: Family Medicine

## 2012-07-14 DIAGNOSIS — M79605 Pain in left leg: Secondary | ICD-10-CM

## 2012-07-20 ENCOUNTER — Ambulatory Visit
Admission: RE | Admit: 2012-07-20 | Discharge: 2012-07-20 | Disposition: A | Discharge status: Home / Self Care | Payer: Medicare Other | Source: Ambulatory Visit | Attending: Family Medicine | Admitting: Family Medicine

## 2012-07-20 DIAGNOSIS — M79605 Pain in left leg: Secondary | ICD-10-CM

## 2012-07-20 MED ORDER — IOHEXOL 180 MG/ML  SOLN
1.0000 mL | Freq: Once | INTRAMUSCULAR | Status: AC | PRN
  Administered 2012-07-20: 1 mL via EPIDURAL

## 2012-07-20 MED ORDER — METHYLPREDNISOLONE ACETATE 40 MG/ML INJ SUSP (RADIOLOG
120.0000 mg | Freq: Once | INTRAMUSCULAR | Status: AC
  Administered 2012-07-20: 120 mg via EPIDURAL

## 2012-07-21 ENCOUNTER — Other Ambulatory Visit: Payer: Medicare Other

## 2012-10-02 ENCOUNTER — Other Ambulatory Visit: Payer: Self-pay | Admitting: Family Medicine

## 2012-10-02 ENCOUNTER — Ambulatory Visit
Admission: RE | Admit: 2012-10-02 | Discharge: 2012-10-02 | Disposition: A | Discharge status: Home / Self Care | Payer: Medicare Other | Source: Ambulatory Visit | Attending: Family Medicine | Admitting: Family Medicine

## 2012-10-02 DIAGNOSIS — R0989 Other specified symptoms and signs involving the circulatory and respiratory systems: Secondary | ICD-10-CM

## 2012-12-14 ENCOUNTER — Other Ambulatory Visit (HOSPITAL_COMMUNITY): Payer: Self-pay | Admitting: Cardiovascular Disease

## 2012-12-14 DIAGNOSIS — R079 Chest pain, unspecified: Secondary | ICD-10-CM

## 2012-12-14 DIAGNOSIS — I359 Nonrheumatic aortic valve disorder, unspecified: Secondary | ICD-10-CM

## 2012-12-22 ENCOUNTER — Ambulatory Visit (HOSPITAL_COMMUNITY)
Admission: RE | Admit: 2012-12-22 | Discharge: 2012-12-22 | Disposition: A | Discharge status: Home / Self Care | Payer: Medicare Other | Source: Ambulatory Visit | Attending: Cardiovascular Disease | Admitting: Cardiovascular Disease

## 2012-12-22 DIAGNOSIS — I10 Essential (primary) hypertension: Secondary | ICD-10-CM | POA: Insufficient documentation

## 2012-12-22 DIAGNOSIS — Z8673 Personal history of transient ischemic attack (TIA), and cerebral infarction without residual deficits: Secondary | ICD-10-CM | POA: Insufficient documentation

## 2012-12-22 DIAGNOSIS — R079 Chest pain, unspecified: Secondary | ICD-10-CM

## 2012-12-22 DIAGNOSIS — E119 Type 2 diabetes mellitus without complications: Secondary | ICD-10-CM | POA: Insufficient documentation

## 2012-12-22 DIAGNOSIS — E785 Hyperlipidemia, unspecified: Secondary | ICD-10-CM | POA: Insufficient documentation

## 2012-12-22 DIAGNOSIS — I359 Nonrheumatic aortic valve disorder, unspecified: Secondary | ICD-10-CM | POA: Insufficient documentation

## 2012-12-22 HISTORY — PX: TRANSTHORACIC ECHOCARDIOGRAM: SHX275

## 2012-12-22 HISTORY — PX: CARDIOVASCULAR STRESS TEST: SHX262

## 2012-12-22 MED ORDER — REGADENOSON 0.4 MG/5ML IV SOLN
0.4000 mg | Freq: Once | INTRAVENOUS | Status: AC
  Administered 2012-12-22: 0.4 mg via INTRAVENOUS

## 2012-12-22 MED ORDER — TECHNETIUM TC 99M SESTAMIBI GENERIC - CARDIOLITE
30.6000 | Freq: Once | INTRAVENOUS | Status: AC | PRN
  Administered 2012-12-22: 31 via INTRAVENOUS

## 2012-12-22 MED ORDER — TECHNETIUM TC 99M SESTAMIBI GENERIC - CARDIOLITE
11.0000 | Freq: Once | INTRAVENOUS | Status: AC | PRN
  Administered 2012-12-22: 11 via INTRAVENOUS

## 2012-12-22 NOTE — Procedures (Addendum)
[place] [facility] [address] [place] [address] [phone]  Cardiology Nuclear Med Study  Eduardo Hubbard is a 79 y.o. male     MRN : [id number]     DOB: May 16, 1927  Procedure Date: 12/22/2012  Nuclear Med Background Indication for Stress Test:  Evaluation for Ischemia and Surgical Clearance History:  NO PRIOR CARDIAC HISTORY Cardiac Risk Factors: CVA, Family History - CAD, Hypertension, Lipids, NIDDM and Overweight  Symptoms:  Chest Pain   Nuclear Pre-Procedure Caffeine/Decaff Intake:  7:00pm NPO After: 5:00am   IV Site: R Antecubital  IV 0.9% NS with Angio Cath:  22g  Chest Size (in):  44" IV Started by: Emmit Pomfret, RN  Height: 5\' 11"  (1.803 m)  Cup Size: n/a  BMI:  Body mass index is 28.19 kg/(m^2). Weight:  202 lb (91.627 kg)   Tech Comments:  N/A    Nuclear Med Study 1 or 2 day study: 1 day  Stress Test Type:  Stress  Order Authorizing Provider:  Hazeline Junker   Resting Radionuclide: Technetium 58m Sestamibi  Resting Radionuclide Dose: 11.0 mCi   Stress Radionuclide:  Technetium 84m Sestamibi  Stress Radionuclide Dose: 30.6 mCi           Stress Protocol Rest HR: 67 Stress HR: 80  Rest BP: 156/78 Stress BP: 139/77  Exercise Time (min): n/a METS: n/a   Predicted Max HR: 137 bpm % Max HR: 58.39 bpm Rate Pressure Product: 21308  Dose of Adenosine (mg):  n/a Dose of Lexiscan: 0.4 mg  Dose of Atropine (mg): n/a Dose of Dobutamine: n/a mcg/kg/min (at max HR)  Stress Test Technologist: Esperanza Sheets, CCT Nuclear Technologist: Gonzella Lex, CNMT   Rest Procedure:  Myocardial perfusion imaging was performed at rest 45 minutes following the intravenous administration of Technetium 66m Sestamibi. Stress Procedure:  The patient received IV Lexiscan 0.4 mg over 15-seconds.  Technetium 76m Sestamibi injected at 30-seconds.  There were no significant changes with Lexiscan.  Quantitative spect images were  obtained after a 45 minute delay.  Transient Ischemic Dilatation (Normal <1.22):  0.79 Lung/Heart Ratio (Normal <0.45):  0.31 QGS EDV:  80 ml QGS ESV:  20 ml LV Ejection Fraction: 75%  Signed by Gonzella Lex, CNMT  PHYSICIAN INTERPRETATION  Rest ECG: The initial tracing shows NSR with PAC; The pre-Lexiscan (post attempted ambulation) ECG showed  NSR with ST-T wave changes (mild, ~0.5-1 mm downsloping depression in II, III, aVF, V4-V6) -- Scattered PACs.  Stress ECG: Significant ST abnormalities consistent with ischemia noted in the initial attempt to walk -- 2-4 mm depressions in inferior leads, and ~1-2 mm depressions in V3-V6 (I suspect the residua of these changes remained present ~6-7 min after ambulation when he was converted to Lexiscan injection. No significant change from baseline ECG were noted with Lexiscan injection.  There are scattered PVCs with Treadmill stress, but PACs only with Lexiscan.  Treadmill ambulation was abandoned due to the patient's knee & back pain, no CP was noted.  QPS Raw Data Images:  Patient motion noted.  There is no interference from nuclear activity from structures below the diaphragm.     Stress Images:  There is decreased uptake in the basal to mid inferior wall -- a small to moderate sized, moderate intensity perfusion defect with no reversibility.   Otherwise, there is mild apical thinning with normal uptake in other regions.   Rest Images:  There is decreased uptake in the inferior wall.  There is mild apical thinning with normal uptake in other regions.  No significant change from Stress images. Subtraction (SDS):  There is a fixed defect that possibly consistent with a previous infarction based upon the distribution, but cannot exclude diaphragmatic attenuation. The presence of mildly reduced thickening in the mid inferior wall would suggest possible prior infarct.  No reversibility is appreciated.    Impression Exercise Capacity:  Exercise was  aborted due to back and knee pain.  Lexiscan was used with ~7 min delay. BP Response:  Mildly hypertensive response to exercise, but normal pressure response to [place],. Clinical Symptoms:  There is dyspnea. - with Lexiscan.  No angina during exercise. ECG Impression:  No significant ECG changes with Lexiscan.   Significant ST abnormalities consistent with ischemia during the ~4 minutes of exercise.  LV Wall Motion:  NL LV Function; NL Wall Motion - mildly reduced thickening in the inferior wall.  Comparison with Prior Nuclear Study: No images to compare  Overall Impression:  Low risk stress nuclear study.  Abnormal myocardial perfusion imagine with possible mid to basal inferior infarction, but no evidence of significant ischemia.  Clinical correlation is warranted.   Marykay Lex, MD  12/22/2012 6:26 PM

## 2012-12-22 NOTE — Progress Notes (Signed)
[facility]   2D echo completed 12/22/2012.   Cindy Shirley Decamp, RDCS  

## 2013-03-15 ENCOUNTER — Ambulatory Visit
Admission: RE | Admit: 2013-03-15 | Discharge: 2013-03-15 | Disposition: A | Discharge status: Home / Self Care | Payer: Medicare Other | Source: Ambulatory Visit | Attending: Family Medicine | Admitting: Family Medicine

## 2013-03-15 ENCOUNTER — Other Ambulatory Visit: Payer: Self-pay | Admitting: Family Medicine

## 2013-03-15 DIAGNOSIS — T8140XA Infection following a procedure, unspecified, initial encounter: Secondary | ICD-10-CM

## 2013-06-27 ENCOUNTER — Encounter: Payer: Self-pay | Admitting: Internal Medicine

## 2013-06-28 ENCOUNTER — Ambulatory Visit (INDEPENDENT_AMBULATORY_CARE_PROVIDER_SITE_OTHER): Payer: Medicare Other | Admitting: Internal Medicine

## 2013-06-28 ENCOUNTER — Encounter: Payer: Self-pay | Admitting: Internal Medicine

## 2013-06-28 VITALS — BP 180/70 | HR 62 | Ht 71.25 in | Wt 194.8 lb

## 2013-06-28 DIAGNOSIS — E785 Hyperlipidemia, unspecified: Secondary | ICD-10-CM

## 2013-06-28 DIAGNOSIS — I359 Nonrheumatic aortic valve disorder, unspecified: Secondary | ICD-10-CM

## 2013-06-28 DIAGNOSIS — I10 Essential (primary) hypertension: Secondary | ICD-10-CM

## 2013-06-28 DIAGNOSIS — I350 Nonrheumatic aortic (valve) stenosis: Secondary | ICD-10-CM | POA: Insufficient documentation

## 2013-06-28 MED ORDER — IRBESARTAN 300 MG PO TABS: 300.0000 mg | ORAL_TABLET | Freq: Every day | ORAL | Status: DC

## 2013-06-28 NOTE — Patient Instructions (Signed)
Your physician wants you to follow-up in: 6 months. You will receive a reminder letter in the mail two months in advance. If you don't receive a letter, please call our office to schedule the follow-up appointment.  Stop losartan. Start taking irbesartan 300mg  daily.

## 2013-06-28 NOTE — Progress Notes (Signed)
OFFICE NOTE  Chief Complaint:  Routine follow-up  Primary Care Physician: Eduardo Fiscal, Hubbard  HPI:  Eduardo Hubbard is a pleasant 79 year old male appears to follow by Eduardo Hubbard. He has a history of hypertension, gout, diabetes type 2 and remote TIA in the past. He also has systemic hypertension and aortic stenosis. He had his episode of TIA in 2005 and has been on Persantine since then. Recently he underwent lumbar spinal surgery at [facility] and underwent a stress test and echocardiogram. The stress test was negative for ischemia an echocardiogram in April 2014 showed mild aortic stenosis and mild aortic insufficiency. Gases hypertension and it has been running generally high. In addition he has dyslipidemia and was previously on alternating dose pravastatin.  He is essentially asymptomatic and feels markedly improved since he had his back surgery.  PMHx:  Past Medical History  Diagnosis Date  . Type II or unspecified type diabetes mellitus without mention of complication, not stated as uncontrolled   . Hypertension   . Hyperlipemia   . Prostatitis   . PVC (premature ventricular contraction)   . Unspecified gastritis and gastroduodenitis without mention of hemorrhage   . Anemia   . Diverticulosis of colon (without mention of hemorrhage)   . Internal hemorrhoids without mention of complication   . Hepatitis A 1953  . Gout   . Hypogonadism male   . Aortic stenosis   . Esophageal reflux   . Renal insufficiency   . Degenerative arthritis of left knee   . Elevated prostate specific antigen (PSA)   . Other symptoms involving cardiovascular system   . Cardiac arrhythmia due to congenital heart disease   . Arthritis     Left Knee  . Stroke 2009  . Cataract     BILATERAL REMOVED  . TIA (transient ischemic attack)     Past Surgical History  Procedure Laterality Date  . Inguinal hernia repair      bilateral  . Cataract extraction w/ intraocular lens  implant, bilateral   2011  . Colonoscopy    . Carotid doppler  05/11/2012    Doppler velocities suggest less than 40% stenosis of the bilateral proximal interal carotid arteries  . Cardiovascular stress test  12/22/2012    Possible mid to basal inferior infarction, but no evidence of significant ischemia  . Transthoracic echocardiogram  12/22/2012    EF not noted, otherwise normal-mild    FAMHx:  Family History  Problem Relation Age of Onset  . COPD Mother   . Diabetes Father   . Heart disease Father   . Throat cancer Brother     died age 68  . COPD Sister   . Diabetes Son     SOCHx:   reports that he has never smoked. He has never used smokeless tobacco. He reports that he does not drink alcohol or use illicit drugs.  ALLERGIES:  No Known Allergies  ROS: A comprehensive review of systems was negative except for: Cardiovascular: positive for lower extremity edema  HOME MEDS: Current Outpatient Prescriptions  Medication Sig Dispense Refill  . acetaminophen (TYLENOL) 500 MG tablet Take 500 mg by mouth every 6 (six) hours as needed for pain.      Marland Kitchen allopurinol (ZYLOPRIM) 300 MG tablet Take 300 mg by mouth daily.        Marland Kitchen aspirin 81 MG tablet Take 81 mg by mouth daily.        . chlorthalidone (HYGROTON) 25 MG tablet Take 25 mg  by mouth daily.      . cholecalciferol (VITAMIN D) 1000 UNITS tablet Take 1,000 Units by mouth daily.        Marland Kitchen dipyridamole (PERSANTINE) 50 MG tablet Take 100 mg by mouth 4 (four) times daily.       . ferrous sulfate 325 (65 FE) MG tablet Take 325 mg by mouth daily with breakfast.      . finasteride (PROSCAR) 5 MG tablet Take 5 mg by mouth daily.      Marland Kitchen glyBURIDE (DIABETA) 2.5 MG tablet Take 2.5 mg by mouth daily with breakfast.        . metoprolol succinate (TOPROL-XL) 50 MG 24 hr tablet Take 50 mg by mouth daily. Take with or immediately following a meal.      . Multiple Vitamins-Minerals (PRESERVISION AREDS 2 PO) Take by mouth 2 (two) times daily.        Marland Kitchen omeprazole  (PRILOSEC OTC) 20 MG tablet Take 20 mg by mouth daily as needed.      . pravastatin (PRAVACHOL) 80 MG tablet Take 80 mg by mouth daily.        . sitaGLIPtin (JANUVIA) 50 MG tablet Take 50 mg by mouth daily.        . irbesartan (AVAPRO) 300 MG tablet Take 1 tablet (300 mg total) by mouth at bedtime.  30 tablet  11   No current facility-administered medications for this visit.    LABS/IMAGING: No results found for this or any previous visit (from the past 48 hour(s)). No results found.  VITALS: BP 180/70  Pulse 62  Ht 5' 11.25" (1.81 m)  Wt 194 lb 12.8 oz (88.361 kg)  BMI 26.97 kg/m2  EXAM: General appearance: alert and no distress Neck: no carotid bruit and no JVD Lungs: clear to auscultation bilaterally Heart: regular rate and rhythm, S1, S2 normal and systolic murmur: early systolic 3/6, crescendo at 2nd left intercostal space Abdomen: soft, non-tender; bowel sounds normal; no masses,  no organomegaly Extremities: edema trace bilateral Pulses: 2+ and symmetric Skin: Skin color, texture, turgor normal. No rashes or lesions Neurologic: Grossly normal Psych: Pleasant mood, affect  EKG: Normal sinus rhythm at 62  ASSESSMENT: 1. Hypertension-not well controlled 2. Dyslipidemia-on pravastatin 3. History of TIA - no reoccurrence on persantine 4. Aortic stenosis - mild, will need surveillance every annually  PLAN: 1.   Eduardo Hubbard is doing fairly well. He does have mild aortic stenosis and will need aggressive lipid therapy as well as hypertension control. His blood pressure is not at goal at this time. I did recheck in the office today and it came down to 160 systolic. He tends to have been running higher for some time he reports even his blood pressures at home run in the 140s to 150s systolic. Therefore I would like to change his medications, I would recommend switching his losartan to a more powerful ARB, Irbesartan 300 mg daily.  This should help him get his blood pressure  down to goal.  Would recommend followup with [place] in 6 months.  Eduardo Hubbard, [facility] Attending Cardiologist [facility]  Eduardo Hubbard Mullens C 06/28/2013, 5:41 PM

## 2013-08-23 ENCOUNTER — Ambulatory Visit: Payer: Self-pay | Admitting: Podiatry

## 2013-08-27 ENCOUNTER — Ambulatory Visit (INDEPENDENT_AMBULATORY_CARE_PROVIDER_SITE_OTHER): Payer: Medicare Other | Admitting: Podiatry

## 2013-08-27 ENCOUNTER — Encounter: Payer: Self-pay | Admitting: Podiatry

## 2013-08-27 VITALS — BP 145/61 | HR 61 | Resp 16

## 2013-08-27 DIAGNOSIS — B351 Tinea unguium: Secondary | ICD-10-CM

## 2013-08-27 DIAGNOSIS — M79609 Pain in unspecified limb: Secondary | ICD-10-CM

## 2013-08-27 NOTE — Patient Instructions (Signed)
Diabetes and Foot Care Diabetes may cause you to have problems because of poor blood supply (circulation) to your feet and legs. This may cause the skin on your feet to become thinner, break easier, and heal more slowly. Your skin may become dry, and the skin may peel and crack. You may also have nerve damage in your legs and feet causing decreased feeling in them. You may not notice minor injuries to your feet that could lead to infections or more serious problems. Taking care of your feet is one of the most important things you can do for yourself.  HOME CARE INSTRUCTIONS  Wear shoes at all times, even in the house. Do not go barefoot. Bare feet are easily injured.  Check your feet daily for blisters, cuts, and redness. If you cannot see the bottom of your feet, use a mirror or ask someone for help.  Wash your feet with warm water (do not use hot water) and mild soap. Then pat your feet and the areas between your toes until they are completely dry. Do not soak your feet as this can dry your skin.  Apply a moisturizing lotion or petroleum jelly (that does not contain alcohol and is unscented) to the skin on your feet and to dry, brittle toenails. Do not apply lotion between your toes.  Trim your toenails straight across. Do not dig under them or around the cuticle. File the edges of your nails with an emery board or nail file.  Do not cut corns or calluses or try to remove them with medicine.  Wear clean socks or stockings every day. Make sure they are not too tight. Do not wear knee-high stockings since they may decrease blood flow to your legs.  Wear shoes that fit properly and have enough cushioning. To break in new shoes, wear them for just a few hours a day. This prevents you from injuring your feet. Always look in your shoes before you put them on to be sure there are no objects inside.  Do not cross your legs. This may decrease the blood flow to your feet.  If you find a minor scrape,  cut, or break in the skin on your feet, keep it and the skin around it clean and dry. These areas may be cleansed with mild soap and water. Do not cleanse the area with peroxide, alcohol, or iodine.  When you remove an adhesive bandage, be sure not to damage the skin around it.  If you have a wound, look at it several times a day to make sure it is healing.  Do not use heating pads or hot water bottles. They may burn your skin. If you have lost feeling in your feet or legs, you may not know it is happening until it is too late.  Make sure your health care provider performs a complete foot exam at least annually or more often if you have foot problems. Report any cuts, sores, or bruises to your health care provider immediately. SEEK MEDICAL CARE IF:   You have an injury that is not healing.  You have cuts or breaks in the skin.  You have an ingrown nail.  You notice redness on your legs or feet.  You feel burning or tingling in your legs or feet.  You have pain or cramps in your legs and feet.  Your legs or feet are numb.  Your feet always feel cold. SEEK IMMEDIATE MEDICAL CARE IF:   There is increasing redness,   swelling, or pain in or around a wound.  There is a red line that goes up your leg.  Pus is coming from a wound.  You develop a fever or as directed by your health care provider.  You notice a bad smell coming from an ulcer or wound. Document Released: 08/27/2000 Document Revised: 05/02/2013 Document Reviewed: 02/06/2013 [employer] Patient Information 2014 [employer].  

## 2013-08-27 NOTE — Progress Notes (Signed)
Subjective:     Patient ID: Eduardo Hubbard, male   DOB: 1926/12/19, 79 y.o.   MRN: [id number]  HPI patient is found to have painful nail bed with thickness and debris 1-5 both feet that he cannot cut   Review of Systems     Objective:   Physical Exam The nailbeds are thick and painful when pressed    Assessment:     Mycotic nail infection with pain 1-5 both feet    Plan:     Debridement painful nailbeds 1-5 both feet

## 2013-11-19 ENCOUNTER — Ambulatory Visit (INDEPENDENT_AMBULATORY_CARE_PROVIDER_SITE_OTHER): Payer: Medicare Other | Admitting: Podiatry

## 2013-11-19 VITALS — BP 153/72 | HR 66 | Resp 16 | Ht 71.0 in | Wt 191.0 lb

## 2013-11-19 DIAGNOSIS — B351 Tinea unguium: Secondary | ICD-10-CM

## 2013-11-19 DIAGNOSIS — M79609 Pain in unspecified limb: Secondary | ICD-10-CM

## 2013-11-20 NOTE — Progress Notes (Signed)
Subjective:     Patient ID: Eduardo Hubbard, male   DOB: 30-Mar-1927, 80 y.o.   MRN: [id number]  HPI patient presents with nail disease and discomfort 1-5 both feet better and possible for him to cut   Review of Systems     Objective:   Physical Exam Patient is well oriented x3 with mycotic painful nailbeds when pressed 1-5 both feet that are thickened    Assessment:     Chronic mycotic nail infection with pain 1-5 both feet    Plan:     Debridement of painful nailbeds 1-5 both feet with no iatrogenic bleeding noted

## 2013-12-18 ENCOUNTER — Encounter: Payer: Self-pay | Admitting: Internal Medicine

## 2013-12-18 ENCOUNTER — Ambulatory Visit (INDEPENDENT_AMBULATORY_CARE_PROVIDER_SITE_OTHER): Payer: Medicare Other | Admitting: Internal Medicine

## 2013-12-18 VITALS — BP 162/64 | HR 64 | Ht 71.5 in | Wt 193.2 lb

## 2013-12-18 DIAGNOSIS — I359 Nonrheumatic aortic valve disorder, unspecified: Secondary | ICD-10-CM

## 2013-12-18 DIAGNOSIS — E785 Hyperlipidemia, unspecified: Secondary | ICD-10-CM

## 2013-12-18 DIAGNOSIS — I350 Nonrheumatic aortic (valve) stenosis: Secondary | ICD-10-CM

## 2013-12-18 DIAGNOSIS — I10 Essential (primary) hypertension: Secondary | ICD-10-CM

## 2013-12-18 NOTE — Patient Instructions (Signed)
Your physician wants you to follow-up in:  6 months. You will receive a reminder letter in the mail two months in advance. If you don't receive a letter, please call our office to schedule the follow-up appointment.   

## 2013-12-18 NOTE — Progress Notes (Signed)
OFFICE NOTE  Chief Complaint:  Routine follow-up  Primary Care Physician: Eduardo Land, MD  HPI:  Eduardo Hubbard is a pleasant 80 year old male appears to follow by Dr. Rollene Hubbard. He has a history of hypertension, gout, diabetes type 2 and remote TIA in the past. He also has systemic hypertension and aortic stenosis. He had his episode of TIA in 2005 and has been on Persantine since then. Recently he underwent lumbar spinal surgery at [facility] and underwent a stress test and echocardiogram. The stress test was negative for ischemia an echocardiogram in April 2014 showed mild aortic stenosis and mild aortic insufficiency. He has hypertension and it has been running generally high. In addition he has dyslipidemia and was previously on alternating dose pravastatin.  He is essentially asymptomatic and feels markedly improved since he had his back surgery.  At his last office visit, I switched his losartan to irbesartan 300 mg daily. He has been taking this medication without any incident. He notes his blood pressure generally much better controlled at home in the 875 to 643 systolic range.  He denies any chest pain or shortness of breath. Unfortunately his wife is currently in the hospital and he is struggling with this emotionally, but generally seems to be doing well.  PMHx:  Past Medical History  Diagnosis Date  . Type II or unspecified type diabetes mellitus without mention of complication, not stated as uncontrolled   . Hypertension   . Hyperlipemia   . Prostatitis   . PVC (premature ventricular contraction)   . Unspecified gastritis and gastroduodenitis without mention of hemorrhage   . Anemia   . Diverticulosis of colon (without mention of hemorrhage)   . Internal hemorrhoids without mention of complication   . Hepatitis A 1953  . Gout   . Hypogonadism male   . Aortic stenosis   . Esophageal reflux   . Renal insufficiency   . Degenerative arthritis of left knee   .  Elevated prostate specific antigen (PSA)   . Other symptoms involving cardiovascular system   . Cardiac arrhythmia due to congenital heart disease   . Arthritis     Left Knee  . Stroke 2009  . Cataract     BILATERAL REMOVED  . TIA (transient ischemic attack)     Past Surgical History  Procedure Laterality Date  . Inguinal hernia repair      bilateral  . Cataract extraction w/ intraocular lens  implant, bilateral  2011  . Colonoscopy    . Carotid doppler  05/11/2012    Doppler velocities suggest less than 40% stenosis of the bilateral proximal interal carotid arteries  . Cardiovascular stress test  12/22/2012    Possible mid to basal inferior infarction, but no evidence of significant ischemia  . Transthoracic echocardiogram  12/22/2012    EF not noted, otherwise normal-mild    FAMHx:  Family History  Problem Relation Age of Onset  . COPD Mother   . Diabetes Father   . Heart disease Father   . Throat cancer Brother     died age 67  . COPD Sister   . Diabetes Son     SOCHx:   reports that he has never smoked. He has never used smokeless tobacco. He reports that he does not drink alcohol or use illicit drugs.  ALLERGIES:  No Known Allergies  ROS: A comprehensive review of systems was negative.  HOME MEDS: Current Outpatient Prescriptions  Medication Sig Dispense Refill  . acetaminophen (  TYLENOL) 500 MG tablet Take 500 mg by mouth every 6 (six) hours as needed for pain.      . AGGRENOX 25-200 MG per 12 hr capsule Take 1 capsule by mouth 2 (two) times daily.      Marland Kitchen allopurinol (ZYLOPRIM) 300 MG tablet Take 300 mg by mouth daily.        Marland Kitchen aspirin 81 MG tablet Take 81 mg by mouth daily.        . chlorthalidone (HYGROTON) 25 MG tablet Take 12.5 mg by mouth daily.       . cholecalciferol (VITAMIN D) 1000 UNITS tablet Take 1,000 Units by mouth daily.        . CRESTOR 40 MG tablet Take 20 mg by mouth daily.      . ferrous sulfate 325 (65 FE) MG tablet Take 325 mg by mouth  daily with breakfast.      . finasteride (PROSCAR) 5 MG tablet Take 5 mg by mouth daily.      Marland Kitchen glyBURIDE (DIABETA) 2.5 MG tablet Take 2.5 mg by mouth daily with breakfast.        . irbesartan (AVAPRO) 300 MG tablet Take 1 tablet (300 mg total) by mouth at bedtime.  30 tablet  11  . metoprolol succinate (TOPROL-XL) 50 MG 24 hr tablet Take 50 mg by mouth daily. Take with or immediately following a meal.      . Multiple Vitamins-Minerals (PRESERVISION AREDS 2 PO) Take by mouth 2 (two) times daily.        Marland Kitchen omeprazole (PRILOSEC OTC) 20 MG tablet Take 20 mg by mouth daily as needed.      . sitaGLIPtin (JANUVIA) 50 MG tablet Take 50 mg by mouth daily.         No current facility-administered medications for this visit.    LABS/IMAGING: No results found for this or any previous visit (from the past 48 hour(s)). No results found.  VITALS: BP 162/64  Pulse 64  Ht 5' 11.5" (1.816 m)  Wt 193 lb 3.2 oz (87.635 kg)  BMI 26.57 kg/m2  EXAM: General appearance: alert and no distress Neck: no carotid bruit and no JVD Lungs: clear to auscultation bilaterally Heart: regular rate and rhythm, S1, S2 normal and systolic murmur: early systolic 3/6, crescendo at 2nd left intercostal space Abdomen: soft, non-tender; bowel sounds normal; no masses,  no organomegaly Extremities: edema trace bilateral Pulses: 2+ and symmetric Skin: Skin color, texture, turgor normal. No rashes or lesions Neurologic: Grossly normal Psych: Pleasant mood, affect  EKG: Normal sinus rhythm at 64  ASSESSMENT: 1. Hypertension-controlled 2. Dyslipidemia-on pravastatin 3. History of TIA - no reoccurrence on persantine 4. Aortic stenosis - mild, will need surveillance every annually  PLAN: 1.   [name] is doing fairly well. He seems to have better blood pressure control and irbesartan in addition to his other blood pressure medications. Overall is doing well and will be due for recheck of his cholesterol in 6  months.  Pixie Casino, MD, [facility] Attending Cardiologist [facility]  [name] C 12/18/2013, 8:29 AM

## 2014-02-25 ENCOUNTER — Encounter: Payer: Self-pay | Admitting: Podiatry

## 2014-02-25 ENCOUNTER — Ambulatory Visit (INDEPENDENT_AMBULATORY_CARE_PROVIDER_SITE_OTHER): Payer: Medicare Other | Admitting: Podiatry

## 2014-02-25 DIAGNOSIS — M79609 Pain in unspecified limb: Secondary | ICD-10-CM

## 2014-02-25 DIAGNOSIS — B351 Tinea unguium: Secondary | ICD-10-CM

## 2014-02-25 NOTE — Patient Instructions (Signed)
Diabetes and Foot Care Diabetes may cause you to have problems because of poor blood supply (circulation) to your feet and legs. This may cause the skin on your feet to become thinner, break easier, and heal more slowly. Your skin may become dry, and the skin may peel and crack. You may also have nerve damage in your legs and feet causing decreased feeling in them. You may not notice minor injuries to your feet that could lead to infections or more serious problems. Taking care of your feet is one of the most important things you can do for yourself.  HOME CARE INSTRUCTIONS  Wear shoes at all times, even in the house. Do not go barefoot. Bare feet are easily injured.  Check your feet daily for blisters, cuts, and redness. If you cannot see the bottom of your feet, use a mirror or ask someone for help.  Wash your feet with warm water (do not use hot water) and mild soap. Then pat your feet and the areas between your toes until they are completely dry. Do not soak your feet as this can dry your skin.  Apply a moisturizing lotion or petroleum jelly (that does not contain alcohol and is unscented) to the skin on your feet and to dry, brittle toenails. Do not apply lotion between your toes.  Trim your toenails straight across. Do not dig under them or around the cuticle. File the edges of your nails with an emery board or nail file.  Do not cut corns or calluses or try to remove them with medicine.  Wear clean socks or stockings every day. Make sure they are not too tight. Do not wear knee-high stockings since they may decrease blood flow to your legs.  Wear shoes that fit properly and have enough cushioning. To break in new shoes, wear them for just a few hours a day. This prevents you from injuring your feet. Always look in your shoes before you put them on to be sure there are no objects inside.  Do not cross your legs. This may decrease the blood flow to your feet.  If you find a minor scrape,  cut, or break in the skin on your feet, keep it and the skin around it clean and dry. These areas may be cleansed with mild soap and water. Do not cleanse the area with peroxide, alcohol, or iodine.  When you remove an adhesive bandage, be sure not to damage the skin around it.  If you have a wound, look at it several times a day to make sure it is healing.  Do not use heating pads or hot water bottles. They may burn your skin. If you have lost feeling in your feet or legs, you may not know it is happening until it is too late.  Make sure your health care provider performs a complete foot exam at least annually or more often if you have foot problems. Report any cuts, sores, or bruises to your health care provider immediately. SEEK MEDICAL CARE IF:   You have an injury that is not healing.  You have cuts or breaks in the skin.  You have an ingrown nail.  You notice redness on your legs or feet.  You feel burning or tingling in your legs or feet.  You have pain or cramps in your legs and feet.  Your legs or feet are numb.  Your feet always feel cold. SEEK IMMEDIATE MEDICAL CARE IF:   There is increasing redness,   swelling, or pain in or around a wound.  There is a red line that goes up your leg.  Pus is coming from a wound.  You develop a fever or as directed by your health care provider.  You notice a bad smell coming from an ulcer or wound. Document Released: 08/27/2000 Document Revised: 05/02/2013 Document Reviewed: 02/06/2013 [employer] Patient Information 2014 [employer].  

## 2014-02-25 NOTE — Progress Notes (Signed)
Subjective:     Patient ID: Eduardo Hubbard, male   DOB: 1927-08-12, 80 y.o.   MRN: [id number]  HPI patient presents with thick brittle nailbeds 1-5 both feet that are painful and he cannot cut   Review of Systems     Objective:   Physical Exam Neurovascular status intact with thick yellow brittle nailbeds 1-5 both feet    Assessment:     Mycotic nail infection with pain 1-5 both feet    Plan:     Debris painful nailbeds 1-5 both feet with no iatrogenic bleeding noted

## 2014-03-06 ENCOUNTER — Other Ambulatory Visit: Payer: Self-pay | Admitting: Family Medicine

## 2014-03-06 DIAGNOSIS — E221 Hyperprolactinemia: Secondary | ICD-10-CM

## 2014-03-10 ENCOUNTER — Other Ambulatory Visit: Payer: Medicare Other

## 2014-03-13 ENCOUNTER — Ambulatory Visit
Admission: RE | Admit: 2014-03-13 | Discharge: 2014-03-13 | Disposition: A | Discharge status: Home / Self Care | Payer: Medicare Other | Source: Ambulatory Visit | Attending: Family Medicine | Admitting: Family Medicine

## 2014-03-13 DIAGNOSIS — E221 Hyperprolactinemia: Secondary | ICD-10-CM

## 2014-05-09 ENCOUNTER — Encounter: Payer: Self-pay | Admitting: Gastroenterology

## 2014-06-06 ENCOUNTER — Ambulatory Visit (INDEPENDENT_AMBULATORY_CARE_PROVIDER_SITE_OTHER): Payer: Medicare Other | Admitting: Podiatry

## 2014-06-06 DIAGNOSIS — B351 Tinea unguium: Secondary | ICD-10-CM

## 2014-06-06 DIAGNOSIS — M79673 Pain in unspecified foot: Secondary | ICD-10-CM

## 2014-06-06 DIAGNOSIS — M79609 Pain in unspecified limb: Secondary | ICD-10-CM

## 2014-06-06 NOTE — Progress Notes (Signed)
   Subjective:    Patient ID: Eduardo Hubbard, male    DOB: 07-Jul-1927, 80 y.o.   MRN: [id number]  HPI  Pt presents for nail debridement  Review of Systems     Objective:   Physical Exam        Assessment & Plan:

## 2014-06-06 NOTE — Patient Instructions (Signed)
Diabetes and Foot Care Diabetes may cause you to have problems because of poor blood supply (circulation) to your feet and legs. This may cause the skin on your feet to become thinner, break easier, and heal more slowly. Your skin may become dry, and the skin may peel and crack. You may also have nerve damage in your legs and feet causing decreased feeling in them. You may not notice minor injuries to your feet that could lead to infections or more serious problems. Taking care of your feet is one of the most important things you can do for yourself.  HOME CARE INSTRUCTIONS  Wear shoes at all times, even in the house. Do not go barefoot. Bare feet are easily injured.  Check your feet daily for blisters, cuts, and redness. If you cannot see the bottom of your feet, use a mirror or ask someone for help.  Wash your feet with warm water (do not use hot water) and mild soap. Then pat your feet and the areas between your toes until they are completely dry. Do not soak your feet as this can dry your skin.  Apply a moisturizing lotion or petroleum jelly (that does not contain alcohol and is unscented) to the skin on your feet and to dry, brittle toenails. Do not apply lotion between your toes.  Trim your toenails straight across. Do not dig under them or around the cuticle. File the edges of your nails with an emery board or nail file.  Do not cut corns or calluses or try to remove them with medicine.  Wear clean socks or stockings every day. Make sure they are not too tight. Do not wear knee-high stockings since they may decrease blood flow to your legs.  Wear shoes that fit properly and have enough cushioning. To break in new shoes, wear them for just a few hours a day. This prevents you from injuring your feet. Always look in your shoes before you put them on to be sure there are no objects inside.  Do not cross your legs. This may decrease the blood flow to your feet.  If you find a minor scrape,  cut, or break in the skin on your feet, keep it and the skin around it clean and dry. These areas may be cleansed with mild soap and water. Do not cleanse the area with peroxide, alcohol, or iodine.  When you remove an adhesive bandage, be sure not to damage the skin around it.  If you have a wound, look at it several times a day to make sure it is healing.  Do not use heating pads or hot water bottles. They may burn your skin. If you have lost feeling in your feet or legs, you may not know it is happening until it is too late.  Make sure your health care provider performs a complete foot exam at least annually or more often if you have foot problems. Report any cuts, sores, or bruises to your health care provider immediately. SEEK MEDICAL CARE IF:   You have an injury that is not healing.  You have cuts or breaks in the skin.  You have an ingrown nail.  You notice redness on your legs or feet.  You feel burning or tingling in your legs or feet.  You have pain or cramps in your legs and feet.  Your legs or feet are numb.  Your feet always feel cold. SEEK IMMEDIATE MEDICAL CARE IF:   There is increasing redness,   swelling, or pain in or around a wound.  There is a red line that goes up your leg.  Pus is coming from a wound.  You develop a fever or as directed by your health care provider.  You notice a bad smell coming from an ulcer or wound. Document Released: 08/27/2000 Document Revised: 05/02/2013 Document Reviewed: 02/06/2013 Exit[employer] Patient Information 2015 [employer]. This information is not intended to replace advice given to you by your health care provider. Make sure you discuss any questions you have with your health care provider.  

## 2014-06-07 NOTE — Progress Notes (Signed)
Subjective:     Patient ID: Eduardo Hubbard, male   DOB: 09/13/1926, 80 y.o.   MRN: [id number]  HPI patient presents with nail disease 1-5 both feet that he cannot cut and they become painful and he is a long-term diabetic   Review of Systems     Objective:   Physical Exam Neurovascular status unchanged with thick yellow brittle nailbeds 1-5 both feet    Assessment:     Mycotic nail infection with pain 1-5 both feet    Plan:     Debris painful nailbeds 1-5 both feet with no iatrogenic bleeding noted      

## 2014-06-17 ENCOUNTER — Encounter: Payer: Self-pay | Admitting: Internal Medicine

## 2014-06-17 ENCOUNTER — Ambulatory Visit (INDEPENDENT_AMBULATORY_CARE_PROVIDER_SITE_OTHER): Payer: Medicare Other | Admitting: Internal Medicine

## 2014-06-17 VITALS — BP 160/72 | HR 58 | Ht 71.0 in | Wt 195.0 lb

## 2014-06-17 DIAGNOSIS — I10 Essential (primary) hypertension: Secondary | ICD-10-CM

## 2014-06-17 DIAGNOSIS — E785 Hyperlipidemia, unspecified: Secondary | ICD-10-CM

## 2014-06-17 DIAGNOSIS — Z79899 Other long term (current) drug therapy: Secondary | ICD-10-CM

## 2014-06-17 DIAGNOSIS — Z8673 Personal history of transient ischemic attack (TIA), and cerebral infarction without residual deficits: Secondary | ICD-10-CM

## 2014-06-17 DIAGNOSIS — I350 Nonrheumatic aortic (valve) stenosis: Secondary | ICD-10-CM

## 2014-06-17 MED ORDER — CHLORTHALIDONE 25 MG PO TABS: 25.0000 mg | ORAL_TABLET | Freq: Every day | ORAL | Status: DC

## 2014-06-17 NOTE — Progress Notes (Signed)
OFFICE NOTE  Chief Complaint:  Routine follow-up  Primary Care Physician: Marylene Land, MD  HPI:  Eduardo Hubbard is a pleasant 80 year old male appears to follow by Dr. Rollene Fare. He has a history of hypertension, gout, diabetes type 2 and remote TIA in the past. He also has systemic hypertension and aortic stenosis. He had his episode of TIA in 2005 and has been on Persantine since then. Recently he underwent lumbar spinal surgery at [facility] and underwent a stress test and echocardiogram. The stress test was negative for ischemia an echocardiogram in April 2014 showed mild aortic stenosis and mild aortic insufficiency. He has hypertension and it has been running generally high. In addition he has dyslipidemia and was previously on alternating dose pravastatin.  He is essentially asymptomatic and feels markedly improved since he had his back surgery.  At his last office visit, I switched his losartan to irbesartan 300 mg daily. He has been taking this medication without any incident. He notes his blood pressure generally much better controlled at home in the 465 to 035 systolic range.  He denies any chest pain or shortness of breath. Unfortunately his wife is currently in the hospital and he is struggling with this emotionally, but generally seems to be doing well.  Eduardo Hubbard returns for followup today. He occasionally has some swelling in his left leg but no other complaints. He does have knee problems and was contemplating knee surgery, or his wife has been in the hospital and has required significant care. He is putting that off until she gets better. His blood pressure is better controlled. He was up today however recheck showed it came down to normal limits.  PMHx:  Past Medical History  Diagnosis Date  . Type II or unspecified type diabetes mellitus without mention of complication, not stated as uncontrolled   . Hypertension   . Hyperlipemia   . Prostatitis   . PVC  (premature ventricular contraction)   . Unspecified gastritis and gastroduodenitis without mention of hemorrhage   . Anemia   . Diverticulosis of colon (without mention of hemorrhage)   . Internal hemorrhoids without mention of complication   . Hepatitis A 1953  . Gout   . Hypogonadism male   . Aortic stenosis   . Esophageal reflux   . Renal insufficiency   . Degenerative arthritis of left knee   . Elevated prostate specific antigen (PSA)   . Other symptoms involving cardiovascular system   . Cardiac arrhythmia due to congenital heart disease   . Arthritis     Left Knee  . Stroke 2009  . Cataract     BILATERAL REMOVED  . TIA (transient ischemic attack)     Past Surgical History  Procedure Laterality Date  . Inguinal hernia repair      bilateral  . Cataract extraction w/ intraocular lens  implant, bilateral  2011  . Colonoscopy    . Carotid doppler  05/11/2012    Doppler velocities suggest less than 40% stenosis of the bilateral proximal interal carotid arteries  . Cardiovascular stress test  12/22/2012    Possible mid to basal inferior infarction, but no evidence of significant ischemia  . Transthoracic echocardiogram  12/22/2012    EF not noted, otherwise normal-mild    FAMHx:  Family History  Problem Relation Age of Onset  . COPD Mother   . Diabetes Father   . Heart disease Father   . Throat cancer Brother     died  age 53  . COPD Sister   . Diabetes Son     SOCHx:   reports that he has never smoked. He has never used smokeless tobacco. He reports that he does not drink alcohol or use illicit drugs.  ALLERGIES:  No Known Allergies  ROS: A comprehensive review of systems was negative.  HOME MEDS: Current Outpatient Prescriptions  Medication Sig Dispense Refill  . acetaminophen (TYLENOL) 500 MG tablet Take 500 mg by mouth every 6 (six) hours as needed for pain.      . AGGRENOX 25-200 MG per 12 hr capsule Take 1 capsule by mouth 2 (two) times daily.      Marland Kitchen  allopurinol (ZYLOPRIM) 300 MG tablet Take 300 mg by mouth daily.        Marland Kitchen aspirin 81 MG tablet Take 81 mg by mouth daily.        . chlorthalidone (HYGROTON) 25 MG tablet Take 1 tablet (25 mg total) by mouth daily.  30 tablet  6  . cholecalciferol (VITAMIN D) 1000 UNITS tablet Take 1,000 Units by mouth daily.        . CRESTOR 40 MG tablet Take 20 mg by mouth daily.      Marland Kitchen glyBURIDE (DIABETA) 2.5 MG tablet Take 2.5 mg by mouth daily with breakfast.        . hydrochlorothiazide (HYDRODIURIL) 12.5 MG tablet Take 12.5 mg by mouth daily.      . irbesartan (AVAPRO) 300 MG tablet Take 1 tablet (300 mg total) by mouth at bedtime.  30 tablet  11  . metoprolol succinate (TOPROL-XL) 50 MG 24 hr tablet Take 50 mg by mouth daily. Take with or immediately following a meal.      . Multiple Vitamins-Minerals (PRESERVISION AREDS 2 PO) Take by mouth 2 (two) times daily.        Marland Kitchen omeprazole (PRILOSEC OTC) 20 MG tablet Take 20 mg by mouth daily as needed.      . sitaGLIPtin (JANUVIA) 50 MG tablet Take 50 mg by mouth daily.         No current facility-administered medications for this visit.    LABS/IMAGING: No results found for this or any previous visit (from the past 48 hour(s)). No results found.  VITALS: BP 160/72  Pulse 58  Ht 5\' 11"  (1.803 m)  Wt 195 lb (88.451 kg)  BMI 27.21 kg/m2  EXAM: General appearance: alert and no distress Neck: no carotid bruit and no JVD Lungs: clear to auscultation bilaterally Heart: regular rate and rhythm, S1, S2 normal and systolic murmur: early systolic 3/6, crescendo at 2nd left intercostal space Abdomen: soft, non-tender; bowel sounds normal; no masses,  no organomegaly Extremities: edema trace bilateral Pulses: 2+ and symmetric Skin: Skin color, texture, turgor normal. No rashes or lesions Neurologic: Grossly normal Psych: Pleasant mood, affect  EKG: Sinus bradycardia 58  ASSESSMENT: 1. Hypertension-controlled 2. Dyslipidemia-on pravastatin 3. History  of TIA - no reoccurrence on persantine 4. Aortic stenosis - mild, will need surveillance every annually  PLAN: 1.   Eduardo Hubbard is doing fairly well. He seems to have better blood pressure control and irbesartan in addition to his other blood pressure medications. Overall is doing well and will be due for recheck of his cholesterol in 6 months. He will not need a repeat echocardiogram until next year.  Pixie Casino, MD, [facility] Attending Cardiologist [facility]  Saleen Peden C 06/17/2014, 2:29 PM

## 2014-06-17 NOTE — Patient Instructions (Signed)
Your physician has recommended you make the following change in your medication...  1. INCREASE chlorthalidone to 25mg  once daily   Your physician recommends that you return for lab work - [facility] wants you to follow-up in: 6 months with Dr. Debara Pickett. You will receive a reminder letter in the mail two months in advance. If you don't receive a letter, please call our office to schedule the follow-up appointment.

## 2014-06-19 LAB — LIPID PANEL
CHOL/HDL RATIO: 3.5 ratio
Cholesterol: 131 mg/dL (ref 0–200)
HDL: 37 mg/dL — ABNORMAL LOW (ref >39)
LDL Cholesterol: 52 mg/dL (ref 0–99)
Triglycerides: 209 mg/dL — ABNORMAL HIGH (ref <150)
VLDL: 42 mg/dL — AB (ref 0–40)

## 2014-06-19 LAB — BASIC METABOLIC PANEL
BUN: 41 mg/dL — ABNORMAL HIGH (ref 6–23)
CO2: 24 mEq/L (ref 19–32)
Calcium: 9.5 mg/dL (ref 8.4–10.5)
Chloride: 108 mEq/L (ref 96–112)
Creat: 2.6 mg/dL — ABNORMAL HIGH (ref 0.50–1.35)
Glucose, Bld: 121 mg/dL — ABNORMAL HIGH (ref 70–99)
POTASSIUM: 4.8 meq/L (ref 3.5–5.3)
SODIUM: 140 meq/L (ref 135–145)

## 2014-06-20 ENCOUNTER — Encounter: Payer: Self-pay | Admitting: *Deleted

## 2014-08-28 ENCOUNTER — Other Ambulatory Visit: Payer: Self-pay | Admitting: Family Medicine

## 2014-08-28 DIAGNOSIS — D649 Anemia, unspecified: Secondary | ICD-10-CM

## 2014-08-28 DIAGNOSIS — R748 Abnormal levels of other serum enzymes: Secondary | ICD-10-CM

## 2014-08-29 ENCOUNTER — Ambulatory Visit
Admission: RE | Admit: 2014-08-29 | Discharge: 2014-08-29 | Disposition: A | Discharge status: Home / Self Care | Payer: Medicare Other | Source: Ambulatory Visit | Attending: Family Medicine | Admitting: Family Medicine

## 2014-08-29 DIAGNOSIS — R748 Abnormal levels of other serum enzymes: Secondary | ICD-10-CM

## 2014-08-29 DIAGNOSIS — D649 Anemia, unspecified: Secondary | ICD-10-CM

## 2014-09-02 ENCOUNTER — Telehealth: Payer: Self-pay | Admitting: Gastroenterology

## 2014-09-02 ENCOUNTER — Encounter: Payer: Self-pay | Admitting: *Deleted

## 2014-09-02 NOTE — Telephone Encounter (Signed)
I spoke with Dr. Sandi Mariscal patient with a few week history of elevated LFTs and liver mass. Patient is scheduled to see Tye Savoy RNP tomorrow at 2:30.    I have left a message for the patient to call back to schedule.

## 2014-09-03 ENCOUNTER — Encounter: Payer: Self-pay | Admitting: Nurse Practitioner

## 2014-09-03 ENCOUNTER — Other Ambulatory Visit (INDEPENDENT_AMBULATORY_CARE_PROVIDER_SITE_OTHER): Payer: Medicare Other

## 2014-09-03 ENCOUNTER — Ambulatory Visit (INDEPENDENT_AMBULATORY_CARE_PROVIDER_SITE_OTHER)
Admission: RE | Admit: 2014-09-03 | Discharge: 2014-09-03 | Disposition: A | Discharge status: Home / Self Care | Payer: Medicare Other | Source: Ambulatory Visit | Attending: Nurse Practitioner | Admitting: Nurse Practitioner

## 2014-09-03 ENCOUNTER — Ambulatory Visit (INDEPENDENT_AMBULATORY_CARE_PROVIDER_SITE_OTHER): Payer: Medicare Other | Admitting: Nurse Practitioner

## 2014-09-03 VITALS — BP 138/62 | HR 60 | Ht 71.0 in | Wt 184.0 lb

## 2014-09-03 DIAGNOSIS — R945 Abnormal results of liver function studies: Secondary | ICD-10-CM

## 2014-09-03 DIAGNOSIS — R7989 Other specified abnormal findings of blood chemistry: Secondary | ICD-10-CM

## 2014-09-03 DIAGNOSIS — R933 Abnormal findings on diagnostic imaging of other parts of digestive tract: Secondary | ICD-10-CM

## 2014-09-03 DIAGNOSIS — R634 Abnormal weight loss: Secondary | ICD-10-CM

## 2014-09-03 DIAGNOSIS — Z5181 Encounter for therapeutic drug level monitoring: Secondary | ICD-10-CM

## 2014-09-03 LAB — PROTIME-INR
INR: 1.2 ratio — AB (ref 0.8–1.0)
PROTHROMBIN TIME: 13.8 s — AB (ref 9.6–13.1)

## 2014-09-03 NOTE — Telephone Encounter (Signed)
Patient confirmed he will be here today to see Tye Savoy RNP

## 2014-09-03 NOTE — Patient Instructions (Signed)
Your physician has requested that you go to the basement for the following lab work before leaving today: PT/INR  Your provider has requested that you have a chest x ray before leaving today. Please go to the basement floor to our Radiology department for the test.  We have contacted radiology about a liver biopsy. They should call you to set this up. If you do not hear from them by Thursday morning, please call our office at [phone] and ask for Dottie.

## 2014-09-04 ENCOUNTER — Other Ambulatory Visit (HOSPITAL_COMMUNITY): Payer: Self-pay | Admitting: Family Medicine

## 2014-09-04 ENCOUNTER — Ambulatory Visit (HOSPITAL_COMMUNITY)
Admission: RE | Admit: 2014-09-04 | Discharge: 2014-09-04 | Disposition: A | Discharge status: Home / Self Care | Payer: Medicare Other | Source: Ambulatory Visit | Attending: Family | Admitting: Family

## 2014-09-04 DIAGNOSIS — I6523 Occlusion and stenosis of bilateral carotid arteries: Secondary | ICD-10-CM

## 2014-09-05 ENCOUNTER — Telehealth: Payer: Self-pay | Admitting: Nurse Practitioner

## 2014-09-05 NOTE — Telephone Encounter (Signed)
Left message for patient that radiology is waiting for radiologist review before scheduling anything. I will be in touch with them first thing Monday morning for update.

## 2014-09-06 ENCOUNTER — Encounter: Payer: Self-pay | Admitting: Nurse Practitioner

## 2014-09-06 NOTE — Progress Notes (Addendum)
HPI :   Patient is an 80 year old male known to Dr. Fuller Plan for a history of adenomatous colon polyps. Last colonoscopy October 2012 with removal of a 60m ascending oolon polyp and 2 small adenomatous transverse colon polyps. Surveillance colonoscopies were not recommended in light of patient's age.   Patient referred by PCP for evaluation of a large liver lesion seen on CTscan and MRI. LFTs were normal in April. Now alk phos is mid 200 range and transaminases are mildly elevated.  Patient has no abdominal pain. He reports a 10 pound weight loss over the last several months.     Past Medical History  Diagnosis Date  . Type II or unspecified type diabetes mellitus without mention of complication, not stated as uncontrolled   . Hypertension   . Hyperlipemia   . Prostatitis   . PVC (premature ventricular contraction)   . Unspecified gastritis and gastroduodenitis without mention of hemorrhage   . Anemia   . Diverticulosis of colon (without mention of hemorrhage)   . Internal hemorrhoids without mention of complication   . Hepatitis A 1953  . Gout   . Hypogonadism male   . Aortic stenosis   . Esophageal reflux   . Renal insufficiency   . Degenerative arthritis of left knee   . Elevated prostate specific antigen (PSA)   . Other symptoms involving cardiovascular system   . Cardiac arrhythmia due to congenital heart disease   . Arthritis     Left Knee  . Stroke 2009  . Cataract     BILATERAL REMOVED  . TIA (transient ischemic attack)   . Chronic renal failure   . GERD (gastroesophageal reflux disease)   . Tubular adenoma of colon     Family History  Problem Relation Age of Onset  . COPD Mother   . Diabetes Father   . Heart disease Father   . Throat cancer Brother     died age 592 . COPD Sister   . Diabetes Son   . Prostate cancer Brother    History  Substance Use Topics  . Smoking status: Never Smoker   . Smokeless tobacco: Never Used  . Alcohol Use: No   Current  Outpatient Prescriptions  Medication Sig Dispense Refill  . acetaminophen (TYLENOL) 500 MG tablet Take 500 mg by mouth every 6 (six) hours as needed for pain.    . AGGRENOX 25-200 MG per 12 hr capsule Take 1 capsule by mouth 2 (two) times daily.    .Marland Kitchenallopurinol (ZYLOPRIM) 300 MG tablet Take 300 mg by mouth daily.      .Marland Kitchenaspirin 81 MG tablet Take 81 mg by mouth daily.      . chlorthalidone (HYGROTON) 25 MG tablet Take 1 tablet (25 mg total) by mouth daily. 30 tablet 6  . cholecalciferol (VITAMIN D) 1000 UNITS tablet Take 1,000 Units by mouth daily.      . CRESTOR 40 MG tablet Take 20 mg by mouth daily.    .Marland KitchenglyBURIDE (DIABETA) 2.5 MG tablet Take 2.5 mg by mouth daily with breakfast.      . metoprolol succinate (TOPROL-XL) 50 MG 24 hr tablet Take 50 mg by mouth daily. Take with or immediately following a meal.    . Multiple Vitamins-Minerals (PRESERVISION AREDS 2 PO) Take by mouth 2 (two) times daily.      .Marland Kitchenomeprazole (PRILOSEC OTC) 20 MG tablet Take 20 mg by mouth daily as needed.    . sitaGLIPtin (  JANUVIA) 50 MG tablet Take 50 mg by mouth daily.       No current facility-administered medications for this visit.   No Known Allergies   Review of Systems: All systems reviewed and negative except where noted in HPI.    Ct Abdomen Pelvis Wo Contrast  08/29/2014   CLINICAL DATA:  Anemia.  Elevated liver enzymes.  EXAM: CT ABDOMEN AND PELVIS WITHOUT CONTRAST  TECHNIQUE: Multidetector CT imaging of the abdomen and pelvis was performed following the standard protocol without IV contrast.  COMPARISON:  None.  FINDINGS: There is inhomogeneous irregular density in the dome of the right lobe of the liver extending posteriorly as well as extending into the hepatic hilum and into the lateral aspect of the left lobe of the liver. There is some respiratory motion artifact but I am concerned that this represents an infiltrative tumor process.  No dilated bile ducts. There is a 4 mm calcification in the  hepatic hilum which I think may represent a stone in the neck of the gallbladder. Gallbladder wall is not thickened.  Spleen, pancreas, and adrenal glands are normal. 3.3 cm cyst in the posterior aspect of the mid right kidney. 1.6 cm cyst on the lateral aspect of the upper pole of the left kidney. No hydronephrosis.  Multiple diverticula in the sigmoid portion of the colon. No diverticulitis. Bladder appears normal. Prostate gland is slightly enlarged.  No free air or free fluid. Extensive atheromatous disease in the abdominal aorta and common iliac arteries.  Previous lumbar fusion at L4-5 and L5-S1. No acute osseous abnormality.  IMPRESSION: Poorly defined abnormal lucency in the dome of the liver worrisome for an infiltrating process such as tumor. There is some respiratory motion artifact but I think that the appearance is radial. This could be further assessed and characterized by abdominal MRI.  Possible solitary small gallstone.  Renal cysts.  Sigmoid diverticulosis.   Electronically Signed   By: Rozetta Nunnery M.D.   On: 08/29/2014 17:04   Dg Chest 2 View  09/03/2014   CLINICAL DATA:  Abnormal LFTs. Weight loss. Recent CT abdomen with question liver mass.  EXAM: CHEST  2 VIEW  COMPARISON:  CT abdomen/ pelvis 08/29/2014, chest radiographs 10/10/2012  FINDINGS: The heart size is normal, there is atherosclerosis of the thoracic aorta. The lungs are clear. Pulmonary vasculature is normal. No consolidation, pleural effusion, or pneumothorax. No acute osseous abnormalities are seen.  IMPRESSION: Atherosclerosis, no acute pulmonary process.   Electronically Signed   By: Jeb Levering M.D.   On: 09/03/2014 16:20    Physical Exam: BP 138/62 mmHg  Pulse 60  Ht _0  (1.803 m)  Wt 184 lb (83.462 kg)  BMI 25.67 kg/m2 Constitutional: Pleasant,well-developed, white male in no acute distress. HEENT: Normocephalic and atraumatic. Conjunctivae are normal. No scleral icterus. Neck supple.  Cardiovascular:  Normal rate, regular rhythm.  Pulmonary/chest: Effort normal and breath sounds normal. No wheezing, rales or rhonchi. Abdominal: Soft, nondistended, nontender. Bowel sounds active throughout. There are no masses palpable. No hepatomegaly. Extremities: no edema Lymphadenopathy: No cervical adenopathy noted. Neurological: Alert and oriented to person place and time. Skin: Skin is warm and dry. No rashes noted. Psychiatric: Normal mood and affect. Behavior is normal.   ASSESSMENT AND PLAN:   62. 80 year old male with abnormal LFTs and a large liver lesion (?infiltrative process) on non-contrast CTscan as above. MRI done at outside facility with comparable findings. Unfortunately both studies were without contrast (allergy and renal disease).  Will scan MRI report into EPIC. Patient will need biopsy of this lesion, will schedule this with IR. In the interim will obtain a CXR (?lymphoma).  If biopsy c/w adenocarcinoma then patient will need EGD / colonoscopy   2. Hx of adenomatous colonoscopy polyps (see HPI)   09/09/14 ADDENDUM: IR called. They would like actual MRI images uploaded to system. We are in process of arranging this. IR will call patient to get biopsy scheduled once images reviewed.    Addendum: Reviewed and agree with initial management. Will cc: Dr. Marry Guan, MD

## 2014-09-09 ENCOUNTER — Other Ambulatory Visit (HOSPITAL_COMMUNITY): Payer: Medicare Other

## 2014-09-09 ENCOUNTER — Ambulatory Visit (INDEPENDENT_AMBULATORY_CARE_PROVIDER_SITE_OTHER): Payer: Medicare Other | Admitting: Podiatry

## 2014-09-09 ENCOUNTER — Encounter: Payer: Self-pay | Admitting: Nurse Practitioner

## 2014-09-09 ENCOUNTER — Telehealth: Payer: Self-pay

## 2014-09-09 DIAGNOSIS — R933 Abnormal findings on diagnostic imaging of other parts of digestive tract: Secondary | ICD-10-CM | POA: Insufficient documentation

## 2014-09-09 DIAGNOSIS — B351 Tinea unguium: Secondary | ICD-10-CM

## 2014-09-09 DIAGNOSIS — R945 Abnormal results of liver function studies: Secondary | ICD-10-CM | POA: Insufficient documentation

## 2014-09-09 DIAGNOSIS — R634 Abnormal weight loss: Secondary | ICD-10-CM | POA: Insufficient documentation

## 2014-09-09 DIAGNOSIS — M79673 Pain in unspecified foot: Secondary | ICD-10-CM

## 2014-09-09 DIAGNOSIS — R7989 Other specified abnormal findings of blood chemistry: Secondary | ICD-10-CM | POA: Insufficient documentation

## 2014-09-09 NOTE — Progress Notes (Signed)
Subjective:     Patient ID: Eduardo Hubbard, male   DOB: 04-19-1927, 80 y.o.   MRN: [id number]  HPI patient presents with nail disease 1-5 both feet that he cannot cut and they become painful and he is a long-term diabetic   Review of Systems     Objective:   Physical Exam Neurovascular status unchanged with thick yellow brittle nailbeds 1-5 both feet    Assessment:     Mycotic nail infection with pain 1-5 both feet    Plan:     Debris painful nailbeds 1-5 both feet with no iatrogenic bleeding noted

## 2014-09-09 NOTE — Telephone Encounter (Signed)
IR needs the abdominal MRI images from [facility] uploaded into [facility]. Information to send the images with patient demographics and MRN # [id number] to [place]. Confirmed with [place] the information was received. Message left for the patient to advise it will be approximately 48 hrs.before IR has reviewed his case.

## 2014-09-10 NOTE — Telephone Encounter (Signed)
Called to [employer] to check on the status of the images. They report no disc received. Called to [facility]. Medical records stated the patient was to pick the desk up. Advised again that the disc was to be courier carried to the [employer].

## 2014-09-11 ENCOUNTER — Inpatient Hospital Stay
Admission: RE | Admit: 2014-09-11 | Discharge: 2014-09-11 | Disposition: A | Discharge status: Home / Self Care | Payer: Self-pay | Source: Ambulatory Visit | Attending: Interventional Radiology | Admitting: Interventional Radiology

## 2014-09-11 ENCOUNTER — Other Ambulatory Visit: Payer: Self-pay | Admitting: Radiology

## 2014-09-11 ENCOUNTER — Other Ambulatory Visit (HOSPITAL_COMMUNITY): Payer: Self-pay | Admitting: Interventional Radiology

## 2014-09-11 DIAGNOSIS — R52 Pain, unspecified: Secondary | ICD-10-CM

## 2014-09-11 NOTE — Telephone Encounter (Signed)
Images received at [facility] ([facility]?) and they are uploaded (will have to be corrected. They were uploaded under heading "HEAD/FACE". Tech at service desk notified) Per Levada Dy, the appointment has been offered to Caleen Jobs (daughter) for 09/12/14 or 09/16/14.

## 2014-09-12 ENCOUNTER — Ambulatory Visit (HOSPITAL_COMMUNITY)
Admission: RE | Admit: 2014-09-12 | Discharge: 2014-09-12 | Disposition: A | Discharge status: Home / Self Care | Payer: Medicare Other | Source: Ambulatory Visit | Attending: Nurse Practitioner | Admitting: Nurse Practitioner

## 2014-09-12 ENCOUNTER — Other Ambulatory Visit: Payer: Self-pay | Admitting: Nurse Practitioner

## 2014-09-12 ENCOUNTER — Encounter (HOSPITAL_COMMUNITY): Payer: Self-pay

## 2014-09-12 VITALS — BP 112/58 | HR 65 | Temp 97.0°F | Resp 20

## 2014-09-12 DIAGNOSIS — R634 Abnormal weight loss: Secondary | ICD-10-CM

## 2014-09-12 DIAGNOSIS — R160 Hepatomegaly, not elsewhere classified: Secondary | ICD-10-CM | POA: Diagnosis present

## 2014-09-12 DIAGNOSIS — I10 Essential (primary) hypertension: Secondary | ICD-10-CM | POA: Diagnosis not present

## 2014-09-12 DIAGNOSIS — R945 Abnormal results of liver function studies: Secondary | ICD-10-CM

## 2014-09-12 DIAGNOSIS — R7989 Other specified abnormal findings of blood chemistry: Secondary | ICD-10-CM

## 2014-09-12 DIAGNOSIS — K219 Gastro-esophageal reflux disease without esophagitis: Secondary | ICD-10-CM | POA: Insufficient documentation

## 2014-09-12 DIAGNOSIS — E119 Type 2 diabetes mellitus without complications: Secondary | ICD-10-CM | POA: Diagnosis not present

## 2014-09-12 DIAGNOSIS — R933 Abnormal findings on diagnostic imaging of other parts of digestive tract: Secondary | ICD-10-CM

## 2014-09-12 DIAGNOSIS — Z8673 Personal history of transient ischemic attack (TIA), and cerebral infarction without residual deficits: Secondary | ICD-10-CM | POA: Diagnosis not present

## 2014-09-12 DIAGNOSIS — E785 Hyperlipidemia, unspecified: Secondary | ICD-10-CM | POA: Insufficient documentation

## 2014-09-12 DIAGNOSIS — M1990 Unspecified osteoarthritis, unspecified site: Secondary | ICD-10-CM | POA: Diagnosis not present

## 2014-09-12 LAB — CBC
HCT: 24.6 % — ABNORMAL LOW (ref 39.0–52.0)
Hemoglobin: 8 g/dL — ABNORMAL LOW (ref 13.0–17.0)
MCH: 31.9 pg (ref 26.0–34.0)
MCHC: 32.5 g/dL (ref 30.0–36.0)
MCV: 98 fL (ref 78.0–100.0)
Platelets: 436 10*3/uL — ABNORMAL HIGH (ref 150–400)
RBC: 2.51 MIL/uL — ABNORMAL LOW (ref 4.22–5.81)
RDW: 13.8 % (ref 11.5–15.5)
WBC: 14.3 10*3/uL — ABNORMAL HIGH (ref 4.0–10.5)

## 2014-09-12 LAB — GLUCOSE, CAPILLARY
GLUCOSE-CAPILLARY: 125 mg/dL — AB (ref 70–99)
Glucose-Capillary: 125 mg/dL — ABNORMAL HIGH (ref 70–99)

## 2014-09-12 LAB — PROTIME-INR
INR: 1.24 (ref 0.00–1.49)
Prothrombin Time: 15.8 seconds — ABNORMAL HIGH (ref 11.6–15.2)

## 2014-09-12 LAB — APTT: aPTT: 30 seconds (ref 24–37)

## 2014-09-12 MED ORDER — SODIUM CHLORIDE 0.9 % IV SOLN
INTRAVENOUS | Status: AC | PRN
  Administered 2014-09-12: 10 mL/h via INTRAVENOUS

## 2014-09-12 MED ORDER — FENTANYL CITRATE 0.05 MG/ML IJ SOLN
INTRAMUSCULAR | Status: AC | PRN
  Administered 2014-09-12: 25 ug via INTRAVENOUS

## 2014-09-12 MED ORDER — FENTANYL CITRATE 0.05 MG/ML IJ SOLN
INTRAMUSCULAR | Status: AC
  Filled 2014-09-12: qty 4

## 2014-09-12 MED ORDER — LIDOCAINE HCL (PF) 1 % IJ SOLN
INTRAMUSCULAR | Status: AC
  Filled 2014-09-12: qty 10

## 2014-09-12 MED ORDER — MIDAZOLAM HCL 2 MG/2ML IJ SOLN
INTRAMUSCULAR | Status: AC | PRN
  Administered 2014-09-12: 0.5 mg via INTRAVENOUS

## 2014-09-12 MED ORDER — MIDAZOLAM HCL 2 MG/2ML IJ SOLN
INTRAMUSCULAR | Status: AC
  Filled 2014-09-12: qty 4

## 2014-09-12 MED ORDER — GELATIN ABSORBABLE 12-7 MM EX MISC
CUTANEOUS | Status: AC
  Filled 2014-09-12: qty 1

## 2014-09-12 MED ORDER — SODIUM CHLORIDE 0.9 % IV SOLN: INTRAVENOUS | Status: DC

## 2014-09-12 NOTE — Sedation Documentation (Signed)
02 d/c 

## 2014-09-12 NOTE — Procedures (Signed)
Interventional Radiology Procedure Note  Procedure: US guided liver mass biopsy.   Findings:  Poorly defined, geographic hypoechoic lesions on CT, MR, and also [place].  Targeted biopsy of right lobe lesion.  3 x 18G core samples.  .  Complications: No immediate Recommendations:  - Ok to shower tomorrow - Do not submerge for 7 days - follow up result. - dc in 4 hours   Signed,  Dulcy Fanny. Earleen Newport, DO

## 2014-09-12 NOTE — H&P (Signed)
Chief Complaint: "I'm here for a biopsy"  HPI: Eduardo Hubbard is an 80 y.o. male who is being worked up for abnormal liver enzymes. He has had imaging including MRI. This suggested possible mass/lesion in the dome of the liver. He is referred for biopsy. Imaging reviewed. PMHx, meds, labs reviewed. Has been NPO this am, takes no blood thinners.  Past Medical History:  Past Medical History  Diagnosis Date  . Type II or unspecified type diabetes mellitus without mention of complication, not stated as uncontrolled   . Hypertension   . Hyperlipemia   . Prostatitis   . PVC (premature ventricular contraction)   . Unspecified gastritis and gastroduodenitis without mention of hemorrhage   . Anemia   . Diverticulosis of colon (without mention of hemorrhage)   . Internal hemorrhoids without mention of complication   . Hepatitis A 1953  . Gout   . Hypogonadism male   . Aortic stenosis   . Esophageal reflux   . Renal insufficiency   . Degenerative arthritis of left knee   . Elevated prostate specific antigen (PSA)   . Other symptoms involving cardiovascular system   . Cardiac arrhythmia due to congenital heart disease   . Arthritis     Left Knee  . Stroke 2009  . Cataract     BILATERAL REMOVED  . TIA (transient ischemic attack)   . Elevated LFTs   . Chronic renal failure   . GERD (gastroesophageal reflux disease)   . Tubular adenoma of colon     Past Surgical History:  Past Surgical History  Procedure Laterality Date  . Inguinal hernia repair      bilateral  . Cataract extraction w/ intraocular lens  implant, bilateral Bilateral 2011, 2012  . Colonoscopy    . Carotid doppler  05/11/2012    Doppler velocities suggest less than 40% stenosis of the bilateral proximal interal carotid arteries  . Cardiovascular stress test  12/22/2012    Possible mid to basal inferior infarction, but no evidence of significant ischemia  . Transthoracic echocardiogram  12/22/2012    EF not noted,  otherwise normal-mild  . Anterior fusion lumbar spine      Family History:  Family History  Problem Relation Age of Onset  . COPD Mother   . Diabetes Father   . Heart disease Father   . Throat cancer Brother     died age 81  . COPD Sister   . Diabetes Son   . Prostate cancer Brother     Social History:  reports that he has never smoked. He has never used smokeless tobacco. He reports that he does not drink alcohol or use illicit drugs.  Allergies:  Allergies  Allergen Reactions  . Ciprofloxacin Rash    (around mouth)    Medications:   Medication List    ASK your doctor about these medications        acetaminophen 500 MG tablet  Commonly known as:  TYLENOL  Take 500 mg by mouth every 6 (six) hours as needed for pain.     AGGRENOX 200-25 MG per 12 hr capsule  Generic drug:  dipyridamole-aspirin  Take 1 capsule by mouth 2 (two) times daily.     allopurinol 300 MG tablet  Commonly known as:  ZYLOPRIM  Take 300 mg by mouth every morning.     aspirin EC 81 MG tablet  Take 81 mg by mouth every evening.     chlorthalidone 25 MG tablet  Commonly known  as:  HYGROTON  Take 1 tablet (25 mg total) by mouth daily.     cholecalciferol 1000 UNITS tablet  Commonly known as:  VITAMIN D  Take 1,000 Units by mouth daily.     glyBURIDE 2.5 MG tablet  Commonly known as:  DIABETA  Take 2.5 mg by mouth daily with breakfast.     metoprolol succinate 50 MG 24 hr tablet  Commonly known as:  TOPROL-XL  Take 50 mg by mouth daily. Take with or immediately following a meal.     omeprazole 20 MG tablet  Commonly known as:  PRILOSEC OTC  Take 20 mg by mouth daily.     PRESERVISION AREDS PO  Take 1 tablet by mouth 2 (two) times daily.     rosuvastatin 40 MG tablet  Commonly known as:  CRESTOR  Take 20 mg by mouth every evening.     sitaGLIPtin 100 MG tablet  Commonly known as:  JANUVIA  Take 50 mg by mouth every morning.        Please HPI for pertinent positives,  otherwise complete 10 system ROS negative.  Physical Exam: BP 136/61 mmHg  Pulse 90  Temp(Src) 99 F (37.2 C) (Oral)  Resp 18  Ht 5\' 11"  (1.803 m)  Wt 184 lb (83.462 kg)  BMI 25.67 kg/m2  SpO2 98% Body mass index is 25.67 kg/(m^2).   General Appearance:  Alert, cooperative, no distress, appears stated age  Head:  Normocephalic, without obvious abnormality, atraumatic  ENT: Unremarkable  Neck: Supple, symmetrical, trachea midline  Lungs:   Clear to auscultation bilaterally, no w/r/r, respirations unlabored without use of accessory muscles.  Heart:  Regular rate and rhythm, S1, S2 normal, no murmur, rub or gallop.  Abdomen:   Soft, non-tender, non distended.  Neurologic: Normal affect, no gross deficits.  Labs: Results for orders placed or performed during the hospital encounter of 09/12/14 (from the past 48 hour(s))  APTT upon arrival     Status: None   Collection Time: 09/12/14  7:31 AM  Result Value Ref Range   aPTT 30 24 - 37 seconds  CBC upon arrival     Status: Abnormal   Collection Time: 09/12/14  7:31 AM  Result Value Ref Range   WBC 14.3 (H) 4.0 - 10.5 K/uL   RBC 2.51 (L) 4.22 - 5.81 MIL/uL   Hemoglobin 8.0 (L) 13.0 - 17.0 g/dL   HCT 24.6 (L) 39.0 - 52.0 %   MCV 98.0 78.0 - 100.0 fL   MCH 31.9 26.0 - 34.0 pg   MCHC 32.5 30.0 - 36.0 g/dL   RDW 13.8 11.5 - 15.5 %   Platelets 436 (H) 150 - 400 K/uL  Protime-INR upon arrival     Status: Abnormal   Collection Time: 09/12/14  7:31 AM  Result Value Ref Range   Prothrombin Time 15.8 (H) 11.6 - 15.2 seconds   INR 1.24 0.00 - 1.49  Glucose, capillary     Status: Abnormal   Collection Time: 09/12/14  7:35 AM  Result Value Ref Range   Glucose-Capillary 125 (H) 70 - 99 mg/dL   Comment 1 Notify RN    Comment 2 Documented in Chart     Imaging: Mr Outside Films Head/face  09/11/2014   This examination belongs to an outside facility and is stored  here for comparison purposes only.  Contact the originating outside   institution for any associated report or interpretation.   Assessment/Plan Possible hepatic lesion Difficult visualization for biopsy. Will  look with [place] and possibly CT for best biopsy attempt. May not be able to see or have safe approach. Pt and family aware this is more technical difficult. Also aware of risks, complication including bleeding. Aware if biopsy performed, may be non-diagnostic. Labs ok Consent signed in chart  Ascencion Dike PA-C 09/12/2014, 8:41 AM

## 2014-09-12 NOTE — Sedation Documentation (Addendum)
Dr Earleen Newport aware elevated WBC and temp 99 degrees. Patient states has felt hot over past 3 to 4 days and has checked temp. The highest it was was 100.8. Denies resp symptoms. No pain with voiding but states maybe more often. Dr Earleen Newport aware patient on ASA and Aggrenox. Last dose of both day before yesterday.

## 2014-09-12 NOTE — Discharge Instructions (Signed)
Liver Biopsy, Care After °These instructions give you information on caring for yourself after your procedure. Your doctor may also give you more specific instructions. Call your doctor if you have any problems or questions after your procedure. °HOME CARE °· Rest at home for 1-2 days or as told by your doctor. °· Have someone stay with you for at least 24 hours. °· Do not do these things in the first 24 hours: °¨ Drive. °¨ Use machinery. °¨ Take care of other people. °¨ Sign legal documents. °¨ Take a bath or shower. °· There are many different ways to close and cover a cut (incision). For example, a cut can be closed with stitches, skin glue, or adhesive strips. Follow your doctor's instructions on: °¨ Taking care of your cut. °¨ Changing and removing your bandage (dressing). °¨ Removing whatever was used to close your cut. °· Do not drink alcohol in the first week. °· Do not lift more than 5 pounds or play contact sports for the first 2 weeks. °· Take medicines only as told by your doctor. For 1 week, do not take medicine that has aspirin in it or medicines like ibuprofen. °· Get your test results. °GET HELP IF: °· A cut bleeds and leaves more than just a small spot of blood. °· A cut is red, puffs up (swells), or hurts more than before. °· Fluid or something else comes from a cut. °· A cut smells bad. °· You have a fever or chills. °GET HELP RIGHT AWAY IF: °· You have swelling, bloating, or pain in your belly (abdomen). °· You get dizzy or faint. °· You have a rash. °· You feel sick to your stomach (nauseous) or throw up (vomit). °· You have trouble breathing, feel short of breath, or feel faint. °· Your chest hurts. °· You have problems talking or seeing. °· You have trouble balancing or moving your arms or legs. °Document Released: 06/08/2008 Document Revised: 01/14/2014 Document Reviewed: 10/26/2013 °Exit[employer]® Patient Information ©2015 [employer]. This information is not intended to replace advice given to  you by your health care provider. Make sure you discuss any questions you have with your health care provider. ° °

## 2014-09-12 NOTE — Sedation Documentation (Signed)
Report given to [facility] on Short Stay. Aware gelfoam was used and no bleeding at site.

## 2014-09-19 ENCOUNTER — Telehealth: Payer: Self-pay | Admitting: Nurse Practitioner

## 2014-09-19 ENCOUNTER — Other Ambulatory Visit: Payer: Self-pay | Admitting: *Deleted

## 2014-09-19 DIAGNOSIS — C229 Malignant neoplasm of liver, not specified as primary or secondary: Secondary | ICD-10-CM

## 2014-09-19 NOTE — Telephone Encounter (Signed)
Spoke with patient's daughter and explained that patient has not signed a release of information. She will have him do this.

## 2014-09-20 ENCOUNTER — Telehealth: Payer: Self-pay | Admitting: Hematology

## 2014-09-20 ENCOUNTER — Encounter (HOSPITAL_COMMUNITY): Payer: Self-pay | Admitting: *Deleted

## 2014-09-20 ENCOUNTER — Emergency Department (HOSPITAL_COMMUNITY): Payer: Medicare Other

## 2014-09-20 ENCOUNTER — Inpatient Hospital Stay (HOSPITAL_COMMUNITY)
Admission: EM | Admit: 2014-09-20 | Discharge: 2014-09-23 | DRG: 683 | Disposition: A | Discharge status: Home / Self Care | Payer: Medicare Other | Attending: Internal Medicine | Admitting: Internal Medicine

## 2014-09-20 DIAGNOSIS — E291 Testicular hypofunction: Secondary | ICD-10-CM | POA: Diagnosis present

## 2014-09-20 DIAGNOSIS — N184 Chronic kidney disease, stage 4 (severe): Secondary | ICD-10-CM | POA: Diagnosis not present

## 2014-09-20 DIAGNOSIS — N179 Acute kidney failure, unspecified: Principal | ICD-10-CM | POA: Diagnosis present

## 2014-09-20 DIAGNOSIS — I10 Essential (primary) hypertension: Secondary | ICD-10-CM | POA: Diagnosis present

## 2014-09-20 DIAGNOSIS — I493 Ventricular premature depolarization: Secondary | ICD-10-CM | POA: Diagnosis not present

## 2014-09-20 DIAGNOSIS — I350 Nonrheumatic aortic (valve) stenosis: Secondary | ICD-10-CM | POA: Diagnosis present

## 2014-09-20 DIAGNOSIS — C229 Malignant neoplasm of liver, not specified as primary or secondary: Secondary | ICD-10-CM | POA: Diagnosis not present

## 2014-09-20 DIAGNOSIS — Z7982 Long term (current) use of aspirin: Secondary | ICD-10-CM | POA: Diagnosis not present

## 2014-09-20 DIAGNOSIS — E860 Dehydration: Secondary | ICD-10-CM | POA: Diagnosis not present

## 2014-09-20 DIAGNOSIS — Z9841 Cataract extraction status, right eye: Secondary | ICD-10-CM

## 2014-09-20 DIAGNOSIS — D649 Anemia, unspecified: Secondary | ICD-10-CM | POA: Diagnosis not present

## 2014-09-20 DIAGNOSIS — R972 Elevated prostate specific antigen [PSA]: Secondary | ICD-10-CM | POA: Diagnosis not present

## 2014-09-20 DIAGNOSIS — R634 Abnormal weight loss: Secondary | ICD-10-CM | POA: Diagnosis present

## 2014-09-20 DIAGNOSIS — E11649 Type 2 diabetes mellitus with hypoglycemia without coma: Secondary | ICD-10-CM | POA: Diagnosis not present

## 2014-09-20 DIAGNOSIS — M1712 Unilateral primary osteoarthritis, left knee: Secondary | ICD-10-CM | POA: Diagnosis not present

## 2014-09-20 DIAGNOSIS — C801 Malignant (primary) neoplasm, unspecified: Secondary | ICD-10-CM | POA: Diagnosis present

## 2014-09-20 DIAGNOSIS — M109 Gout, unspecified: Secondary | ICD-10-CM | POA: Diagnosis present

## 2014-09-20 DIAGNOSIS — E1122 Type 2 diabetes mellitus with diabetic chronic kidney disease: Secondary | ICD-10-CM | POA: Diagnosis not present

## 2014-09-20 DIAGNOSIS — Z9842 Cataract extraction status, left eye: Secondary | ICD-10-CM

## 2014-09-20 DIAGNOSIS — Z85038 Personal history of other malignant neoplasm of large intestine: Secondary | ICD-10-CM

## 2014-09-20 DIAGNOSIS — B159 Hepatitis A without hepatic coma: Secondary | ICD-10-CM | POA: Diagnosis present

## 2014-09-20 DIAGNOSIS — K219 Gastro-esophageal reflux disease without esophagitis: Secondary | ICD-10-CM | POA: Diagnosis present

## 2014-09-20 DIAGNOSIS — Z881 Allergy status to other antibiotic agents status: Secondary | ICD-10-CM | POA: Diagnosis not present

## 2014-09-20 DIAGNOSIS — Z6826 Body mass index (BMI) 26.0-26.9, adult: Secondary | ICD-10-CM | POA: Diagnosis not present

## 2014-09-20 DIAGNOSIS — I129 Hypertensive chronic kidney disease with stage 1 through stage 4 chronic kidney disease, or unspecified chronic kidney disease: Secondary | ICD-10-CM | POA: Diagnosis present

## 2014-09-20 DIAGNOSIS — E785 Hyperlipidemia, unspecified: Secondary | ICD-10-CM | POA: Diagnosis not present

## 2014-09-20 DIAGNOSIS — W19XXXA Unspecified fall, initial encounter: Secondary | ICD-10-CM | POA: Diagnosis present

## 2014-09-20 DIAGNOSIS — E1129 Type 2 diabetes mellitus with other diabetic kidney complication: Secondary | ICD-10-CM | POA: Diagnosis present

## 2014-09-20 DIAGNOSIS — Z8673 Personal history of transient ischemic attack (TIA), and cerebral infarction without residual deficits: Secondary | ICD-10-CM | POA: Diagnosis not present

## 2014-09-20 DIAGNOSIS — T68XXXA Hypothermia, initial encounter: Secondary | ICD-10-CM

## 2014-09-20 HISTORY — DX: Malignant (primary) neoplasm, unspecified: C80.1

## 2014-09-20 LAB — COMPREHENSIVE METABOLIC PANEL
ALT: 33 U/L (ref 0–53)
AST: 42 U/L — ABNORMAL HIGH (ref 0–37)
Albumin: 2.9 g/dL — ABNORMAL LOW (ref 3.5–5.2)
Alkaline Phosphatase: 267 U/L — ABNORMAL HIGH (ref 39–117)
Anion gap: 13 (ref 5–15)
BILIRUBIN TOTAL: 0.6 mg/dL (ref 0.3–1.2)
BUN: 57 mg/dL — AB (ref 6–23)
CHLORIDE: 107 meq/L (ref 96–112)
CO2: 19 mmol/L (ref 19–32)
CREATININE: 3.59 mg/dL — AB (ref 0.50–1.35)
Calcium: 9.3 mg/dL (ref 8.4–10.5)
GFR calc Af Amer: 16 mL/min — ABNORMAL LOW (ref >90)
GFR calc non Af Amer: 14 mL/min — ABNORMAL LOW (ref >90)
Glucose, Bld: 103 mg/dL — ABNORMAL HIGH (ref 70–99)
Potassium: 4.2 mmol/L (ref 3.5–5.1)
Sodium: 139 mmol/L (ref 135–145)
TOTAL PROTEIN: 6.3 g/dL (ref 6.0–8.3)

## 2014-09-20 LAB — CBC WITH DIFFERENTIAL/PLATELET
Basophils Absolute: 0 10*3/uL (ref 0.0–0.1)
Basophils Relative: 0 % (ref 0–1)
EOS ABS: 0 10*3/uL (ref 0.0–0.7)
Eosinophils Relative: 0 % (ref 0–5)
HEMATOCRIT: 28.1 % — AB (ref 39.0–52.0)
Hemoglobin: 9.3 g/dL — ABNORMAL LOW (ref 13.0–17.0)
Lymphocytes Relative: 4 % — ABNORMAL LOW (ref 12–46)
Lymphs Abs: 0.6 10*3/uL — ABNORMAL LOW (ref 0.7–4.0)
MCH: 31.8 pg (ref 26.0–34.0)
MCHC: 33.1 g/dL (ref 30.0–36.0)
MCV: 96.2 fL (ref 78.0–100.0)
Monocytes Absolute: 0.8 10*3/uL (ref 0.1–1.0)
Monocytes Relative: 5 % (ref 3–12)
NEUTROS PCT: 91 % — AB (ref 43–77)
Neutro Abs: 14.6 10*3/uL — ABNORMAL HIGH (ref 1.7–7.7)
Platelets: 401 10*3/uL — ABNORMAL HIGH (ref 150–400)
RBC: 2.92 MIL/uL — ABNORMAL LOW (ref 4.22–5.81)
RDW: 13.9 % (ref 11.5–15.5)
WBC: 16 10*3/uL — AB (ref 4.0–10.5)

## 2014-09-20 LAB — URINALYSIS, ROUTINE W REFLEX MICROSCOPIC
Bilirubin Urine: NEGATIVE
Glucose, UA: NEGATIVE mg/dL
Hgb urine dipstick: NEGATIVE
Ketones, ur: NEGATIVE mg/dL
LEUKOCYTES UA: NEGATIVE
NITRITE: NEGATIVE
Protein, ur: 30 mg/dL — AB
SPECIFIC GRAVITY, URINE: 1.018 (ref 1.005–1.030)
UROBILINOGEN UA: 0.2 mg/dL (ref 0.0–1.0)
pH: 5 (ref 5.0–8.0)

## 2014-09-20 LAB — URINE MICROSCOPIC-ADD ON

## 2014-09-20 LAB — MRSA PCR SCREENING: MRSA by PCR: NEGATIVE

## 2014-09-20 LAB — I-STAT CG4 LACTIC ACID, ED: LACTIC ACID, VENOUS: 1.19 mmol/L (ref 0.5–2.2)

## 2014-09-20 LAB — GLUCOSE, CAPILLARY: Glucose-Capillary: 72 mg/dL (ref 70–99)

## 2014-09-20 LAB — TSH: TSH: 1.006 u[IU]/mL (ref 0.350–4.500)

## 2014-09-20 LAB — CBG MONITORING, ED: Glucose-Capillary: 92 mg/dL (ref 70–99)

## 2014-09-20 MED ORDER — SODIUM CHLORIDE 0.9 % IV BOLUS (SEPSIS)
1000.0000 mL | INTRAVENOUS | Status: AC
  Administered 2014-09-20: 1000 mL via INTRAVENOUS

## 2014-09-20 MED ORDER — ONDANSETRON HCL 4 MG/2ML IJ SOLN
4.0000 mg | Freq: Four times a day (QID) | INTRAMUSCULAR | Status: DC | PRN
  Administered 2014-09-21: 4 mg via INTRAVENOUS
  Filled 2014-09-20: qty 2

## 2014-09-20 MED ORDER — TETANUS-DIPHTH-ACELL PERTUSSIS 5-2.5-18.5 LF-MCG/0.5 IM SUSP
0.5000 mL | Freq: Once | INTRAMUSCULAR | Status: AC
  Administered 2014-09-20: 0.5 mL via INTRAMUSCULAR
  Filled 2014-09-20: qty 0.5

## 2014-09-20 MED ORDER — VANCOMYCIN HCL IN DEXTROSE 1-5 GM/200ML-% IV SOLN: 1000.0000 mg | Freq: Once | INTRAVENOUS | Status: DC

## 2014-09-20 MED ORDER — ALLOPURINOL 100 MG PO TABS
200.0000 mg | ORAL_TABLET | Freq: Every day | ORAL | Status: DC
  Administered 2014-09-20 – 2014-09-23 (×4): 200 mg via ORAL
  Filled 2014-09-20 (×4): qty 2

## 2014-09-20 MED ORDER — ACETAMINOPHEN 500 MG PO TABS: 500.0000 mg | ORAL_TABLET | Freq: Four times a day (QID) | ORAL | Status: DC | PRN

## 2014-09-20 MED ORDER — ASPIRIN EC 81 MG PO TBEC
81.0000 mg | DELAYED_RELEASE_TABLET | Freq: Every evening | ORAL | Status: DC
  Administered 2014-09-20 – 2014-09-22 (×3): 81 mg via ORAL
  Filled 2014-09-20 (×4): qty 1

## 2014-09-20 MED ORDER — PANTOPRAZOLE SODIUM 40 MG PO TBEC
40.0000 mg | DELAYED_RELEASE_TABLET | Freq: Every day | ORAL | Status: DC
  Administered 2014-09-20 – 2014-09-23 (×4): 40 mg via ORAL
  Filled 2014-09-20 (×4): qty 1

## 2014-09-20 MED ORDER — PIPERACILLIN-TAZOBACTAM 3.375 G IVPB 30 MIN: 3.3750 g | Freq: Once | INTRAVENOUS | Status: DC

## 2014-09-20 MED ORDER — ALLOPURINOL 300 MG PO TABS: 300.0000 mg | ORAL_TABLET | Freq: Every morning | ORAL | Status: DC

## 2014-09-20 MED ORDER — SENNOSIDES-DOCUSATE SODIUM 8.6-50 MG PO TABS
1.0000 | ORAL_TABLET | Freq: Every evening | ORAL | Status: DC | PRN
  Administered 2014-09-22: 1 via ORAL
  Filled 2014-09-20 (×2): qty 1

## 2014-09-20 MED ORDER — SODIUM CHLORIDE 0.9 % IV SOLN
INTRAVENOUS | Status: DC
  Administered 2014-09-20: 19:00:00 via INTRAVENOUS
  Administered 2014-09-21: 1000 mL via INTRAVENOUS

## 2014-09-20 MED ORDER — INSULIN ASPART 100 UNIT/ML ~~LOC~~ SOLN
0.0000 [IU] | Freq: Three times a day (TID) | SUBCUTANEOUS | Status: DC
  Administered 2014-09-21: 1 [IU] via SUBCUTANEOUS
  Administered 2014-09-22: 2 [IU] via SUBCUTANEOUS
  Administered 2014-09-22: 1 [IU] via SUBCUTANEOUS
  Administered 2014-09-23: 3 [IU] via SUBCUTANEOUS

## 2014-09-20 MED ORDER — OMEPRAZOLE MAGNESIUM 20 MG PO TBEC: 20.0000 mg | DELAYED_RELEASE_TABLET | Freq: Every day | ORAL | Status: DC

## 2014-09-20 MED ORDER — ROSUVASTATIN CALCIUM 20 MG PO TABS
20.0000 mg | ORAL_TABLET | Freq: Every evening | ORAL | Status: DC
  Administered 2014-09-20: 20 mg via ORAL
  Filled 2014-09-20 (×2): qty 1

## 2014-09-20 MED ORDER — ONDANSETRON HCL 4 MG PO TABS: 4.0000 mg | ORAL_TABLET | Freq: Four times a day (QID) | ORAL | Status: DC | PRN

## 2014-09-20 MED ORDER — ASPIRIN-DIPYRIDAMOLE ER 25-200 MG PO CP12
1.0000 | ORAL_CAPSULE | Freq: Two times a day (BID) | ORAL | Status: DC
  Administered 2014-09-20 – 2014-09-23 (×6): 1 via ORAL
  Filled 2014-09-20 (×7): qty 1

## 2014-09-20 MED ORDER — DOCUSATE SODIUM 100 MG PO CAPS
100.0000 mg | ORAL_CAPSULE | Freq: Two times a day (BID) | ORAL | Status: DC
Start: 2014-09-20 — End: 2014-09-23
  Administered 2014-09-21 – 2014-09-23 (×5): 100 mg via ORAL
  Filled 2014-09-20 (×5): qty 1

## 2014-09-20 MED ORDER — ACETAMINOPHEN 325 MG PO TABS
650.0000 mg | ORAL_TABLET | Freq: Once | ORAL | Status: AC
  Administered 2014-09-20: 650 mg via ORAL
  Filled 2014-09-20: qty 2

## 2014-09-20 MED ORDER — SODIUM CHLORIDE 0.9 % IV BOLUS (SEPSIS)
500.0000 mL | INTRAVENOUS | Status: AC
  Administered 2014-09-20: 500 mL via INTRAVENOUS

## 2014-09-20 MED ORDER — OXYCODONE HCL 5 MG PO TABS: 5.0000 mg | ORAL_TABLET | ORAL | Status: DC | PRN

## 2014-09-20 MED ORDER — HEPARIN SODIUM (PORCINE) 5000 UNIT/ML IJ SOLN
5000.0000 [IU] | Freq: Three times a day (TID) | INTRAMUSCULAR | Status: DC
  Administered 2014-09-21 – 2014-09-23 (×7): 5000 [IU] via SUBCUTANEOUS
  Filled 2014-09-20 (×10): qty 1

## 2014-09-20 NOTE — ED Notes (Signed)
Dr. Lenna Sciara at bedside attempted radial artery stick without success. Cultures and i-stat obtained from femoral stick by dr Winfred Leeds.

## 2014-09-20 NOTE — H&P (Signed)
[facility] History and Physical  BERNHARD KOSKINEN QHU:[id number] DOB: Sep 08, 1927 DOA: 09/20/2014  Referring physician: EDP PCP: Marylene Land, MD   Chief Complaint: fall, weakness  HPI: Eduardo Hubbard is a 81 y.o. male  With DM, HTN, CKD, recently diagnosed with malignant lesion in liver fell while at home.  Got up from bed to walk to bathroom, became dizzy and fell.  In ED, initial rectal temperature 93 degrees.  CBG 31 last night. Blood glucose in ED normal.  WBC 16,000. hgb 9. Creatinine 3.6, about 2.6  2 months ago. CXR, UA  Without infection. Patient denies cough, dysuria, rash.  Endorses poor appetite and 10 lb weight loss over the past month. Has yet to f/u with oncology.  Temperature currently normal after bear hugger.  Lactate normal   Review of Systems:  Systems reviewed. Otherwise negative  Past Medical History  Diagnosis Date  . Type II or unspecified type diabetes mellitus without mention of complication, not stated as uncontrolled   . Hypertension   . Hyperlipemia   . Prostatitis   . PVC (premature ventricular contraction)   . Unspecified gastritis and gastroduodenitis without mention of hemorrhage   . Anemia   . Diverticulosis of colon (without mention of hemorrhage)   . Internal hemorrhoids without mention of complication   . Hepatitis A 1953  . Gout   . Hypogonadism male   . Aortic stenosis   . Esophageal reflux   . Renal insufficiency   . Degenerative arthritis of left knee   . Elevated prostate specific antigen (PSA)   . Other symptoms involving cardiovascular system   . Cardiac arrhythmia due to congenital heart disease   . Arthritis     Left Knee  . Stroke 2009  . Cataract     BILATERAL REMOVED  . TIA (transient ischemic attack)   . Elevated LFTs   . Chronic renal failure   . GERD (gastroesophageal reflux disease)   . Tubular adenoma of colon   . Cancer    Past Surgical History  Procedure Laterality Date  . Inguinal hernia repair       bilateral  . Cataract extraction w/ intraocular lens  implant, bilateral Bilateral 2011, 2012  . Colonoscopy    . Carotid doppler  05/11/2012    Doppler velocities suggest less than 40% stenosis of the bilateral proximal interal carotid arteries  . Cardiovascular stress test  12/22/2012    Possible mid to basal inferior infarction, but no evidence of significant ischemia  . Transthoracic echocardiogram  12/22/2012    EF not noted, otherwise normal-mild  . Anterior fusion lumbar spine     Social History:  reports that he has never smoked. He has never used smokeless tobacco. He reports that he does not drink alcohol or use illicit drugs.  Allergies  Allergen Reactions  . Ciprofloxacin Rash    (around mouth)  . Contrast Media [Iodinated Diagnostic Agents] Rash    Family History  Problem Relation Age of Onset  . COPD Mother   . Diabetes Father   . Heart disease Father   . Throat cancer Brother     died age 66  . COPD Sister   . Diabetes Son   . Prostate cancer Brother      Prior to Admission medications   Medication Sig Start Date End Date Taking? Authorizing Provider  acetaminophen (TYLENOL) 500 MG tablet Take 500 mg by mouth every 6 (six) hours as needed for pain.   Yes Historical  Provider, MD  AGGRENOX 25-200 MG per 12 hr capsule Take 1 capsule by mouth 2 (two) times daily. 11/20/13  Yes Historical Provider, MD  allopurinol (ZYLOPRIM) 300 MG tablet Take 300 mg by mouth every morning.    Yes Historical Provider, MD  aspirin EC 81 MG tablet Take 81 mg by mouth every evening.   Yes Historical Provider, MD  cholecalciferol (VITAMIN D) 1000 UNITS tablet Take 1,000 Units by mouth daily.     Yes Historical Provider, MD  glyBURIDE (DIABETA) 2.5 MG tablet Take 2.5 mg by mouth daily with breakfast.     Yes Historical Provider, MD  metoprolol succinate (TOPROL-XL) 50 MG 24 hr tablet Take 50 mg by mouth daily. Take with or immediately following a meal.   Yes Historical Provider, MD    Multiple Vitamins-Minerals (PRESERVISION AREDS PO) Take 1 tablet by mouth 2 (two) times daily.   Yes Historical Provider, MD  omeprazole (PRILOSEC OTC) 20 MG tablet Take 20 mg by mouth daily.    Yes Historical Provider, MD  oxyCODONE (OXY IR/ROXICODONE) 5 MG immediate release tablet Take 5-10 mg by mouth every 4 (four) hours as needed for severe pain.   Yes Historical Provider, MD  sitaGLIPtin (JANUVIA) 100 MG tablet Take 50 mg by mouth every morning.   Yes Historical Provider, MD  sodium bicarbonate 650 MG tablet Take 650 mg by mouth 2 (two) times daily.    Yes Historical Provider, MD  rosuvastatin (CRESTOR) 40 MG tablet Take 20 mg by mouth every evening.    Historical Provider, MD   Physical Exam: Filed Vitals:   09/20/14 1530 09/20/14 1551 09/20/14 1600 09/20/14 1630  BP: 103/61  105/41 124/74  Pulse: 57  63 62  Temp:  97.9 F (36.6 C)  97.9 F (36.6 C)  TempSrc:  Oral  Oral  Resp: 14  17 20   Height:    5\' 11"  (1.803 m)  Weight:    84.9 kg (187 lb 2.7 oz)  SpO2: 98%  100% 100%    Wt Readings from Last 3 Encounters:  09/20/14 84.9 kg (187 lb 2.7 oz)  09/12/14 83.462 kg (184 lb)  09/03/14 83.462 kg (184 lb)    BP 124/74 mmHg  Pulse 62  Temp(Src) 97.9 F (36.6 C) (Oral)  Resp 20  Ht 5\' 11"  (1.803 m)  Wt 84.9 kg (187 lb 2.7 oz)  BMI 26.12 kg/m2  SpO2 100%  General Appearance:    Alert, cooperative, frail, oriented  Head:    Normocephalic, without obvious abnormality, atraumatic  Eyes:    PERRL, conjunctiva/corneas clear,     Nose:   Nares normal, septum midline, mucosa normal, no drainage   or sinus tenderness  Throat:   Dry MMM  Neck:   Supple, symmetrical, trachea midline, no adenopathy;       thyroid:  No enlargement/tenderness/nodules; no carotid   bruit or JVD  Back:     Symmetric, no curvature, ROM normal, no CVA tenderness  Lungs:     Clear to auscultation bilaterally, respirations unlabored  Chest wall:    No tenderness or deformity  Heart:    Regular rate  and rhythm, systolic harsh murmur  Abdomen:     Soft, non-tender, bowel sounds active all four quadrants,    no masses, no organomegaly  Genitalia:    deferred  Rectal:    deferred  Extremities:   Extremities normal, atraumatic, no cyanosis or edema  Pulses:   2+ and symmetric all extremities  Skin:  Poor turgor. Right arm skin tear  Lymph nodes:   Cervical, supraclavicular, and axillary nodes normal  Neurologic:   CNII-XII intact. Normal strength, sensation and reflexes      throughout             Psych normal affect  Labs on Admission:  Basic Metabolic Panel:  Recent Labs Lab 09/20/14 1150  NA 139  K 4.2  CL 107  CO2 19  GLUCOSE 103*  BUN 57*  CREATININE 3.59*  CALCIUM 9.3   Liver Function Tests:  Recent Labs Lab 09/20/14 1150  AST 42*  ALT 33  ALKPHOS 267*  BILITOT 0.6  PROT 6.3  ALBUMIN 2.9*   No results for input(s): LIPASE, AMYLASE in the last 168 hours. No results for input(s): AMMONIA in the last 168 hours. CBC:  Recent Labs Lab 09/20/14 1150  WBC 16.0*  NEUTROABS 14.6*  HGB 9.3*  HCT 28.1*  MCV 96.2  PLT 401*   Cardiac Enzymes: No results for input(s): CKTOTAL, CKMB, CKMBINDEX, TROPONINI in the last 168 hours.  BNP (last 3 results) No results for input(s): PROBNP in the last 8760 hours. CBG:  Recent Labs Lab 09/20/14 1205  GLUCAP 92    Radiological Exams on Admission: Dg Chest Port 1 View  09/20/2014   CLINICAL DATA:  81 year old male with dizziness weakness, fatigue, fall. Initial encounter.  EXAM: PORTABLE CHEST - 1 VIEW  COMPARISON:  09/03/2014 and earlier.  FINDINGS: Portable AP semi upright view at 1304 hrs. Mildly lower lung volumes. Normal cardiac size and mediastinal contours. Allowing for portable technique, the lungs are clear. No pneumothorax. No acute osseous abnormality identified.  IMPRESSION: Negative portable chest; no acute cardiopulmonary abnormality.   Electronically Signed   By: Lars Pinks M.D.   On: 09/20/2014 13:18      EKG: tracing reviewed.  Assessment/Plan Principal Problem:   Hypothermia: resolved. No infection found. Check TSH, cortisol. Monitor in SDU overnight. Active Problems:   HTN (hypertension): hold antihypertensives for now   Aortic valve stenosis, mild   Fall: PT eval   DM (diabetes mellitus) type II controlled with renal manifestation: hypoglycemic last night. SSI only for now   Carcinoma of liver: outpt oncology f/u planned   Chronic kidney disease (CKD), stage IV (severe):   Anemia: check anemia panel   Acute renal failure: appears dehydrated. IVF   Time spent: [facility]

## 2014-09-20 NOTE — ED Provider Notes (Signed)
CSN: [id number]     Arrival date & time 09/20/14  1118 History   First MD Initiated Contact with Patient 09/20/14 1203     Chief Complaint  Patient presents with  . Fatigue  . Dizziness  . Fall     (Consider location/radiation/quality/duration/timing/severity/associated sxs/prior Treatment) HPI Patient reports she was lightheaded this morning and fell. He did not injure himself. He presently feels generalized weakness. Denies other complaint. Denies cough denies pain anywhere. No other associated symptoms. Patient's blood sugar was 31 last night. His daughter reports she's not been eating well. No other associated symptoms. Recently diagnosed with liver cancer by biopsy. No treatment prior to coming here. Past Medical History  Diagnosis Date  . Type II or unspecified type diabetes mellitus without mention of complication, not stated as uncontrolled   . Hypertension   . Hyperlipemia   . Prostatitis   . PVC (premature ventricular contraction)   . Unspecified gastritis and gastroduodenitis without mention of hemorrhage   . Anemia   . Diverticulosis of colon (without mention of hemorrhage)   . Internal hemorrhoids without mention of complication   . Hepatitis A 1953  . Gout   . Hypogonadism male   . Aortic stenosis   . Esophageal reflux   . Renal insufficiency   . Degenerative arthritis of left knee   . Elevated prostate specific antigen (PSA)   . Other symptoms involving cardiovascular system   . Cardiac arrhythmia due to congenital heart disease   . Arthritis     Left Knee  . Stroke 2009  . Cataract     BILATERAL REMOVED  . TIA (transient ischemic attack)   . Elevated LFTs   . Chronic renal failure   . GERD (gastroesophageal reflux disease)   . Tubular adenoma of colon   . Cancer    Past Surgical History  Procedure Laterality Date  . Inguinal hernia repair      bilateral  . Cataract extraction w/ intraocular lens  implant, bilateral Bilateral 2011, 2012  . Colonoscopy     . Carotid doppler  05/11/2012    Doppler velocities suggest less than 40% stenosis of the bilateral proximal interal carotid arteries  . Cardiovascular stress test  12/22/2012    Possible mid to basal inferior infarction, but no evidence of significant ischemia  . Transthoracic echocardiogram  12/22/2012    EF not noted, otherwise normal-mild  . Anterior fusion lumbar spine     Family History  Problem Relation Age of Onset  . COPD Mother   . Diabetes Father   . Heart disease Father   . Throat cancer Brother     died age 35  . COPD Sister   . Diabetes Son   . Prostate cancer Brother    History  Substance Use Topics  . Smoking status: Never Smoker   . Smokeless tobacco: Never Used  . Alcohol Use: No    Review of Systems  Constitutional: Positive for appetite change.  HENT: Negative.   Respiratory: Negative.   Cardiovascular: Negative.   Gastrointestinal: Negative.   Musculoskeletal: Negative.   Skin: Negative.   Neurological: Positive for weakness.  Psychiatric/Behavioral: Negative.   All other systems reviewed and are negative.     Allergies  Ciprofloxacin and Contrast media  Home Medications   Prior to Admission medications   Medication Sig Start Date End Date Taking? Authorizing Provider  acetaminophen (TYLENOL) 500 MG tablet Take 500 mg by mouth every 6 (six) hours as needed for pain.  Historical Provider, MD  AGGRENOX 25-200 MG per 12 hr capsule Take 1 capsule by mouth 2 (two) times daily. 11/20/13   Historical Provider, MD  allopurinol (ZYLOPRIM) 300 MG tablet Take 300 mg by mouth every morning.     Historical Provider, MD  aspirin EC 81 MG tablet Take 81 mg by mouth every evening.    Historical Provider, MD  chlorthalidone (HYGROTON) 25 MG tablet Take 1 tablet (25 mg total) by mouth daily. Patient not taking: Reported on 09/12/2014 06/17/14   Pixie Casino, MD  cholecalciferol (VITAMIN D) 1000 UNITS tablet Take 1,000 Units by mouth daily.      Historical  Provider, MD  glyBURIDE (DIABETA) 2.5 MG tablet Take 2.5 mg by mouth daily with breakfast.      Historical Provider, MD  metoprolol succinate (TOPROL-XL) 50 MG 24 hr tablet Take 50 mg by mouth daily. Take with or immediately following a meal.    Historical Provider, MD  Multiple Vitamins-Minerals (PRESERVISION AREDS PO) Take 1 tablet by mouth 2 (two) times daily.    Historical Provider, MD  omeprazole (PRILOSEC OTC) 20 MG tablet Take 20 mg by mouth daily.     Historical Provider, MD  rosuvastatin (CRESTOR) 40 MG tablet Take 20 mg by mouth every evening.    Historical Provider, MD  sitaGLIPtin (JANUVIA) 100 MG tablet Take 50 mg by mouth every morning.    Historical Provider, MD   BP 106/41 mmHg  Pulse 54  Temp(Src) 93.1 F (33.9 C) (Rectal)  Resp 16  Ht 5\' 11"  (1.803 m)  Wt 184 lb (83.462 kg)  BMI 25.67 kg/m2  SpO2 100% Physical Exam  Constitutional: He appears well-developed and well-nourished.  HENT:  Head: Normocephalic and atraumatic.  Eyes: Conjunctivae are normal. Pupils are equal, round, and reactive to light.  Neck: Neck supple. No tracheal deviation present. No thyromegaly present.  Cardiovascular: Regular rhythm.   No murmur heard. Bradycardic  Pulmonary/Chest: Effort normal and breath sounds normal.  Abdominal: Soft. Bowel sounds are normal. He exhibits no distension. There is no tenderness.  Musculoskeletal: Normal range of motion. He exhibits no edema or tenderness.  Neurological: He is alert. Coordination normal.  Skin: Skin is warm and dry. No rash noted.  Superficial skin tears of right forearm  Psychiatric: He has a normal mood and affect.  Nursing note and vitals reviewed.   ED Course  Procedures (including critical care time) Labs Review Labs Reviewed  CULTURE, BLOOD (ROUTINE X 2)  CULTURE, BLOOD (ROUTINE X 2)  URINE CULTURE  CBC WITH DIFFERENTIAL  COMPREHENSIVE METABOLIC PANEL  URINALYSIS, ROUTINE W REFLEX MICROSCOPIC  CBG MONITORING, ED  I-STAT CG4  LACTIC ACID, ED    Imaging Review No results found.   EKG Interpretation   Date/Time:  Friday September 20 2014 11:43:32 EST Ventricular Rate:  51 PR Interval:  194 QRS Duration: 94 QT Interval:  532 QTC Calculation: 490 R Axis:   -27 Text Interpretation:  Sinus bradycardia Left ventricular hypertrophy  Nonspecific ST and T wave abnormality Prolonged QT Abnormal ECG No  significant change since last tracing Confirmed by Winfred Leeds  MD, Reannah Totten  [phone]) on 09/20/2014 12:55:37 PM     Chest xray viewed by me Results for orders placed or performed during the hospital encounter of 09/20/14  CBC with Differential  Result Value Ref Range   WBC 16.0 (H) 4.0 - 10.5 K/uL   RBC 2.92 (L) 4.22 - 5.81 MIL/uL   Hemoglobin 9.3 (L) 13.0 - 17.0 g/dL  HCT 28.1 (L) 39.0 - 52.0 %   MCV 96.2 78.0 - 100.0 fL   MCH 31.8 26.0 - 34.0 pg   MCHC 33.1 30.0 - 36.0 g/dL   RDW 13.9 11.5 - 15.5 %   Platelets 401 (H) 150 - 400 K/uL   Neutrophils Relative % 91 (H) 43 - 77 %   Neutro Abs 14.6 (H) 1.7 - 7.7 K/uL   Lymphocytes Relative 4 (L) 12 - 46 %   Lymphs Abs 0.6 (L) 0.7 - 4.0 K/uL   Monocytes Relative 5 3 - 12 %   Monocytes Absolute 0.8 0.1 - 1.0 K/uL   Eosinophils Relative 0 0 - 5 %   Eosinophils Absolute 0.0 0.0 - 0.7 K/uL   Basophils Relative 0 0 - 1 %   Basophils Absolute 0.0 0.0 - 0.1 K/uL  Comprehensive metabolic panel  Result Value Ref Range   Sodium 139 135 - 145 mmol/L   Potassium 4.2 3.5 - 5.1 mmol/L   Chloride 107 96 - 112 mEq/L   CO2 19 19 - 32 mmol/L   Glucose, Bld 103 (H) 70 - 99 mg/dL   BUN 57 (H) 6 - 23 mg/dL   Creatinine, Ser 3.59 (H) 0.50 - 1.35 mg/dL   Calcium 9.3 8.4 - 10.5 mg/dL   Total Protein 6.3 6.0 - 8.3 g/dL   Albumin 2.9 (L) 3.5 - 5.2 g/dL   AST 42 (H) 0 - 37 U/L   ALT 33 0 - 53 U/L   Alkaline Phosphatase 267 (H) 39 - 117 U/L   Total Bilirubin 0.6 0.3 - 1.2 mg/dL   GFR calc non Af Amer 14 (L) >90 mL/min   GFR calc Af Amer 16 (L) >90 mL/min   Anion gap 13 5 - 15   Urinalysis, Routine w reflex microscopic  Result Value Ref Range   Color, Urine YELLOW YELLOW   APPearance HAZY (A) CLEAR   Specific Gravity, Urine 1.018 1.005 - 1.030   pH 5.0 5.0 - 8.0   Glucose, UA NEGATIVE NEGATIVE mg/dL   Hgb urine dipstick NEGATIVE NEGATIVE   Bilirubin Urine NEGATIVE NEGATIVE   Ketones, ur NEGATIVE NEGATIVE mg/dL   Protein, ur 30 (A) NEGATIVE mg/dL   Urobilinogen, UA 0.2 0.0 - 1.0 mg/dL   Nitrite NEGATIVE NEGATIVE   Leukocytes, UA NEGATIVE NEGATIVE  Urine microscopic-add on  Result Value Ref Range   Squamous Epithelial / LPF RARE RARE   WBC, UA 0-2 <3 WBC/hpf   Bacteria, UA FEW (A) RARE   Casts GRANULAR CAST (A) NEGATIVE  POC CBG, ED  Result Value Ref Range   Glucose-Capillary 92 70 - 99 mg/dL  I-Stat CG4 Lactic Acid, ED  Result Value Ref Range   Lactic Acid, Venous 1.19 0.5 - 2.2 mmol/L   Ct Abdomen Pelvis Wo Contrast  08/29/2014   CLINICAL DATA:  Anemia.  Elevated liver enzymes.  EXAM: CT ABDOMEN AND PELVIS WITHOUT CONTRAST  TECHNIQUE: Multidetector CT imaging of the abdomen and pelvis was performed following the standard protocol without IV contrast.  COMPARISON:  None.  FINDINGS: There is inhomogeneous irregular density in the dome of the right lobe of the liver extending posteriorly as well as extending into the hepatic hilum and into the lateral aspect of the left lobe of the liver. There is some respiratory motion artifact but I am concerned that this represents an infiltrative tumor process.  No dilated bile ducts. There is a 4 mm calcification in the hepatic hilum which I think may  represent a stone in the neck of the gallbladder. Gallbladder wall is not thickened.  Spleen, pancreas, and adrenal glands are normal. 3.3 cm cyst in the posterior aspect of the mid right kidney. 1.6 cm cyst on the lateral aspect of the upper pole of the left kidney. No hydronephrosis.  Multiple diverticula in the sigmoid portion of the colon. No diverticulitis. Bladder  appears normal. Prostate gland is slightly enlarged.  No free air or free fluid. Extensive atheromatous disease in the abdominal aorta and common iliac arteries.  Previous lumbar fusion at L4-5 and L5-S1. No acute osseous abnormality.  IMPRESSION: Poorly defined abnormal lucency in the dome of the liver worrisome for an infiltrating process such as tumor. There is some respiratory motion artifact but I think that the appearance is radial. This could be further assessed and characterized by abdominal MRI.  Possible solitary small gallstone.  Renal cysts.  Sigmoid diverticulosis.   Electronically Signed   By: Rozetta Nunnery M.D.   On: 08/29/2014 17:04   Dg Chest 2 View  09/03/2014   CLINICAL DATA:  Abnormal LFTs. Weight loss. Recent CT abdomen with question liver mass.  EXAM: CHEST  2 VIEW  COMPARISON:  CT abdomen/ pelvis 08/29/2014, chest radiographs 10/10/2012  FINDINGS: The heart size is normal, there is atherosclerosis of the thoracic aorta. The lungs are clear. Pulmonary vasculature is normal. No consolidation, pleural effusion, or pneumothorax. No acute osseous abnormalities are seen.  IMPRESSION: Atherosclerosis, no acute pulmonary process.   Electronically Signed   By: Jeb Levering M.D.   On: 09/03/2014 16:20   US Biopsy  09/12/2014   CLINICAL DATA:  81 year old gentleman with a history of ill-defined lesion within the liver, appearing to be infiltrative on recent CT and MR. No history of prior carcinoma.  He has been referred for a image guided biopsy.  EXAM: ULTRASOUND GUIDED CORE BIOPSY OF RIGHT LIVER LOBE LESION  MEDICATIONS: 5.0 mg IV Versed; 25 mcg IV Fentanyl  Total Moderate Sedation Time: 11  PROCEDURE: The procedure, risks, benefits, and alternatives were explained to the patient. Questions regarding the procedure were encouraged and answered. The patient understands and consents to the procedure.  Ultrasound survey of the liver was performed with images stored and sent to PACs. Lesion  within the right liver lobe was targeted for biopsy.  The right inferior thorax/ upper abdomen was prepped with Betadine in a sterile fashion, and a sterile drape was applied covering the operative field. A sterile gown and sterile gloves were used for the procedure. Local anesthesia was provided with 1% Lidocaine.  Once the patient is prepped and draped sterilely in the skin and subcutaneous tissues were generously infiltrated with 1% lidocaine, a small incision was made with 11 blade scalpel. A 17 gauge trocar needle was advanced under ultrasound guidance into the targeted lesion for biopsy.  Three separate 18 gauge biopsy were retrieved, observed under ultrasound on each needle pass.  The tissue specimen were placed into formalin for transportation to the lab.  Three Gel-Foam pledgets were then infused with a small amount of saline. The needle was removed.  Final image was stored.  The patient tolerated the procedure well and remained hemodynamically stable throughout.  No complications were encountered and no significant blood loss was encounter.  COMPLICATIONS: None.  FINDINGS: Ultrasound survey of the liver demonstrates heterogeneous echotexture of the liver, with multiple focal hypoechoic regions within the right and left liver, corresponding to findings on recent MR and CT. A lesion within the  right lobe approximately 4 cm below the liver capsule was targeted, given its focal hypoechoic appearance.  The needle was identified at the marginal lesion before samples were acquired.  Three separate 18 gauge core biopsy were retrieved.  Final ultrasound image demonstrates no complicating features.  IMPRESSION: Status post ultrasound-guided biopsy of right liver lobe lesion. Tissue specimen sent to pathology for complete histopathologic analysis.  Signed,  Dulcy Fanny. Earleen Newport, DO  [facility]  [facility]   Electronically Signed   By: Corrie Mckusick D.O.   On: 09/12/2014  11:39   Dg Chest Port 1 View  09/20/2014   CLINICAL DATA:  81 year old male with dizziness weakness, fatigue, fall. Initial encounter.  EXAM: PORTABLE CHEST - 1 VIEW  COMPARISON:  09/03/2014 and earlier.  FINDINGS: Portable AP semi upright view at 1304 hrs. Mildly lower lung volumes. Normal cardiac size and mediastinal contours. Allowing for portable technique, the lungs are clear. No pneumothorax. No acute osseous abnormality identified.  IMPRESSION: Negative portable chest; no acute cardiopulmonary abnormality.   Electronically Signed   By: Lars Pinks M.D.   On: 09/20/2014 13:18   Mr Outside Films Head/face  09/11/2014   This examination belongs to an outside facility and is stored  here for comparison purposes only.  Contact the originating outside  institution for any associated report or interpretation.   MDM  Doubt sepsis as cause of hypothermia. Likely hypoglycemia . Therefore antibiotics withheld initially. Patient is pancultured. He does have worsening renal function over 3 months ago Spoke with Dr. Conley Canal Plan admit step down unit Diagnosis #1 hypothermia #2 hypoglycemia #3 renal insufficiency .Final diagnoses:  None  CRITICAL CARE Performed by: Orlie Dakin Total critical care time: 30 minute Critical care time was exclusive of separately billable procedures and treating other patients. Critical care was necessary to treat or prevent imminent or life-threatening deterioration. Critical care was time spent personally by me on the following activities: development of treatment plan with patient and/or surrogate as well as nursing, discussions with consultants, evaluation of patient's response to treatment, examination of patient, obtaining history from patient or surrogate, ordering and performing treatments and interventions, ordering and review of laboratory studies, ordering and review of radiographic studies, pulse oximetry and re-evaluation of patient's  condition.      Orlie Dakin, MD 09/20/14 1531

## 2014-09-20 NOTE — Telephone Encounter (Signed)
LEFT MESSAGE FOR PATIENT DTR KAREN TO RETURN CALL TO SCHEDULE FATHER APPT.

## 2014-09-20 NOTE — ED Notes (Signed)
This RN and Salley Scarlet unable to get blood for phlebotomy stick. Kim Phlebotomy made aware and will attempt.

## 2014-09-20 NOTE — ED Notes (Signed)
Portable xray at bedside.

## 2014-09-20 NOTE — Telephone Encounter (Signed)
LEFT MESSAGE FOR PATIENT TO SCHEDULE NP APPT

## 2014-09-20 NOTE — ED Notes (Signed)
This RN attempted IV access x3 without success. Danae Chen RN attempted access

## 2014-09-20 NOTE — ED Notes (Signed)
Patient has hx of diabetes.  His sugar reported to be 31 last night.  Patient had a fall this morning.  cbg 75 this morning  Patient states he has had some dizziness as well.   Patient only injury is abrasion on the right arm.  Patient states he has a slight headache today.  Important note is that patient was dx with liver Cancer on yesterday.  Patient also has hx of kidney disease, stage  Patient states he has been voiding per usual.  Patient is here with son.

## 2014-09-20 NOTE — ED Notes (Signed)
Attempted report 

## 2014-09-21 DIAGNOSIS — N179 Acute kidney failure, unspecified: Principal | ICD-10-CM

## 2014-09-21 DIAGNOSIS — E1129 Type 2 diabetes mellitus with other diabetic kidney complication: Secondary | ICD-10-CM

## 2014-09-21 DIAGNOSIS — C801 Malignant (primary) neoplasm, unspecified: Secondary | ICD-10-CM

## 2014-09-21 DIAGNOSIS — T68XXXD Hypothermia, subsequent encounter: Secondary | ICD-10-CM

## 2014-09-21 DIAGNOSIS — N058 Unspecified nephritic syndrome with other morphologic changes: Secondary | ICD-10-CM

## 2014-09-21 LAB — CBC WITH DIFFERENTIAL/PLATELET
BASOS PCT: 0 % (ref 0–1)
Basophils Absolute: 0 10*3/uL (ref 0.0–0.1)
EOS PCT: 2 % (ref 0–5)
Eosinophils Absolute: 0.2 10*3/uL (ref 0.0–0.7)
HEMATOCRIT: 20.5 % — AB (ref 39.0–52.0)
Hemoglobin: 6.8 g/dL — CL (ref 13.0–17.0)
Lymphocytes Relative: 10 % — ABNORMAL LOW (ref 12–46)
Lymphs Abs: 1.2 10*3/uL (ref 0.7–4.0)
MCH: 31.9 pg (ref 26.0–34.0)
MCHC: 33.2 g/dL (ref 30.0–36.0)
MCV: 96.2 fL (ref 78.0–100.0)
MONOS PCT: 10 % (ref 3–12)
Monocytes Absolute: 1.1 10*3/uL — ABNORMAL HIGH (ref 0.1–1.0)
NEUTROS ABS: 9.2 10*3/uL — AB (ref 1.7–7.7)
NEUTROS PCT: 79 % — AB (ref 43–77)
Platelets: 324 10*3/uL (ref 150–400)
RBC: 2.13 MIL/uL — AB (ref 4.22–5.81)
RDW: 14.1 % (ref 11.5–15.5)
WBC: 11.7 10*3/uL — AB (ref 4.0–10.5)

## 2014-09-21 LAB — URINE CULTURE

## 2014-09-21 LAB — CORTISOL: CORTISOL PLASMA: 22.9 ug/dL

## 2014-09-21 LAB — FOLATE: Folate: 17.3 ng/mL

## 2014-09-21 LAB — GLUCOSE, CAPILLARY
GLUCOSE-CAPILLARY: 64 mg/dL — AB (ref 70–99)
GLUCOSE-CAPILLARY: 111 mg/dL — AB (ref 70–99)
GLUCOSE-CAPILLARY: 98 mg/dL (ref 70–99)
Glucose-Capillary: 88 mg/dL (ref 70–99)
Glucose-Capillary: 142 mg/dL — ABNORMAL HIGH (ref 70–99)

## 2014-09-21 LAB — BASIC METABOLIC PANEL
ANION GAP: 7 (ref 5–15)
BUN: 54 mg/dL — ABNORMAL HIGH (ref 6–23)
CALCIUM: 8.2 mg/dL — AB (ref 8.4–10.5)
CHLORIDE: 111 meq/L (ref 96–112)
CO2: 19 mmol/L (ref 19–32)
CREATININE: 3.31 mg/dL — AB (ref 0.50–1.35)
GFR calc non Af Amer: 16 mL/min — ABNORMAL LOW (ref >90)
GFR, EST AFRICAN AMERICAN: 18 mL/min — AB (ref >90)
GLUCOSE: 81 mg/dL (ref 70–99)
Potassium: 4.3 mmol/L (ref 3.5–5.1)
Sodium: 137 mmol/L (ref 135–145)

## 2014-09-21 LAB — FERRITIN: Ferritin: 240 ng/mL (ref 22–322)

## 2014-09-21 LAB — IRON AND TIBC
Iron: 17 ug/dL — ABNORMAL LOW (ref 42–165)
SATURATION RATIOS: 10 % — AB (ref 20–55)
TIBC: 177 ug/dL — AB (ref 215–435)
UIBC: 160 ug/dL (ref 125–400)

## 2014-09-21 LAB — ABO/RH: ABO/RH(D): A POS

## 2014-09-21 LAB — PREPARE RBC (CROSSMATCH)

## 2014-09-21 LAB — VITAMIN B12: Vitamin B-12: >2000 pg/mL — ABNORMAL HIGH (ref 211–911)

## 2014-09-21 MED ORDER — ENSURE COMPLETE PO LIQD
237.0000 mL | Freq: Two times a day (BID) | ORAL | Status: DC
  Administered 2014-09-22 – 2014-09-23 (×3): 237 mL via ORAL

## 2014-09-21 MED ORDER — SODIUM CHLORIDE 0.9 % IV SOLN: Freq: Once | INTRAVENOUS | Status: DC

## 2014-09-21 MED ORDER — METOPROLOL SUCCINATE ER 50 MG PO TB24: 50.0000 mg | ORAL_TABLET | Freq: Every day | ORAL | Status: DC

## 2014-09-21 MED ORDER — OXYCODONE HCL 5 MG PO TABS: 5.0000 mg | ORAL_TABLET | ORAL | Status: DC | PRN

## 2014-09-21 MED ORDER — METOPROLOL SUCCINATE ER 25 MG PO TB24
25.0000 mg | ORAL_TABLET | Freq: Every day | ORAL | Status: DC
  Administered 2014-09-21 – 2014-09-23 (×3): 25 mg via ORAL
  Filled 2014-09-21 (×3): qty 1

## 2014-09-21 MED ORDER — SODIUM BICARBONATE 650 MG PO TABS
650.0000 mg | ORAL_TABLET | Freq: Two times a day (BID) | ORAL | Status: DC
  Administered 2014-09-21 – 2014-09-23 (×5): 650 mg via ORAL
  Filled 2014-09-21 (×6): qty 1

## 2014-09-21 MED ORDER — POLYETHYLENE GLYCOL 3350 17 G PO PACK
17.0000 g | PACK | Freq: Every day | ORAL | Status: DC
  Administered 2014-09-21 – 2014-09-23 (×3): 17 g via ORAL
  Filled 2014-09-21 (×3): qty 1

## 2014-09-21 MED ORDER — ACETAMINOPHEN 325 MG PO TABS: 325.0000 mg | ORAL_TABLET | Freq: Four times a day (QID) | ORAL | Status: DC | PRN

## 2014-09-21 NOTE — Progress Notes (Signed)
[place] TEAM 1 - Stepdown/ICU TEAM Progress Note  LOTHAR PREHN UUV:[id number] DOB: [birth year] DOA: 09/20/2014 PCP: Marylene Land, MD  Admit HPI / Brief Narrative: 81 y.o. male with DM, HTN, CKD, recently diagnosed with malignant lesion in liver who fell while at home. Got up from bed to walk to bathroom, became dizzy and fell. In ED, initial rectal temperature 93 degrees. CBG 31 prior to presentation. Blood glucose in ED normal. WBC 16,000. hgb 9. Creatinine 3.6, about 2.6 2 months prior. CXR, UAwithout evidence of infection. Patient denied cough, dysuria, rash. Endorsed poor appetite and 10 lb weight loss over the past month. Has yet to f/u with Oncology.   HPI/Subjective: Pt state he is feeling much better.  No new complaints.  Has not noticed any gross blood loss.  Admits to poor appetite and poor liquid intake for weeks.    Assessment/Plan:  Hypothermia No clear evidence of infection - likely related to severe hypoglycemia   DM2 w/ severe hypoglycemia likely due to ongoing use of glyburide w/ progressive renal failure - follow CBG w/o tx for now   Acute renal failure on CKD stage IV crt 2.18 Jun 2014 - 3.6 at presentation - pt has been loosing weight w/ poor appetite - likely prerenal azotemia +/- contribution from ARB - no ARB/ACE - hydrate - follow crt    HTN BP currently well controlled/borderline low - hold usual BP meds except for BB   Mild AoS  Carcinoma of liver outpt Oncology eval planned (pt currently awaiting appointment) - US guided bx 12/31 in IR w/ path revealing metastatic poorly differentiated carcinoma   Anemia  Likely due to kidney disease, CA, and poor nutritional state - Fe studies pending - transfused 1U PRBC thus far   Code Status: FULL Family Communication: spoke w/ pt and son in law at bedside  Disposition Plan: SDU  Consultants: none  Procedures: none  Antibiotics: none  DVT prophylaxis: SQ heparin   Objective: Blood  pressure 111/65, pulse 65, temperature 98.2 F (36.8 C), temperature source Oral, resp. rate 18, height 5\' 11"  (1.803 m), weight 84.8 kg (186 lb 15.2 oz), SpO2 97 %.  Intake/Output Summary (Last 24 hours) at 09/21/14 1155 Last data filed at 09/21/14 1134  Gross per 24 hour  Intake   1800 ml  Output    825 ml  Net    975 ml   Exam: General: No acute respiratory distress Lungs: Clear to auscultation bilaterally without wheezes or crackles Cardiovascular: Regular rate and rhythm without gallop or rub normal S1 and S2 - 2/6 holosystolic M Abdomen: Nontender, nondistended, soft, bowel sounds positive, no rebound, no ascites, no appreciable mass Extremities: No significant cyanosis, clubbing, or edema bilateral lower extremities  Data Reviewed: Basic Metabolic Panel:  Recent Labs Lab 09/20/14 1150 09/21/14 0233  NA 139 137  K 4.2 4.3  CL 107 111  CO2 19 19  GLUCOSE 103* 81  BUN 57* 54*  CREATININE 3.59* 3.31*  CALCIUM 9.3 8.2*    Liver Function Tests:  Recent Labs Lab 09/20/14 1150  AST 42*  ALT 33  ALKPHOS 267*  BILITOT 0.6  PROT 6.3  ALBUMIN 2.9*   Coags: No results for input(s): INR in the last 168 hours.  Invalid input(s): PT No results for input(s): APTT in the last 168 hours.  CBC:  Recent Labs Lab 09/20/14 1150 09/21/14 0233  WBC 16.0* 11.7*  NEUTROABS 14.6* 9.2*  HGB 9.3* 6.8*  HCT 28.1* 20.5*  MCV  96.2 96.2  PLT 401* 324   CBG:  Recent Labs Lab 09/20/14 1205 09/20/14 2132 09/21/14 0809 09/21/14 0930  GLUCAP 92 72 64* 88    Recent Results (from the past 240 hour(s))  MRSA PCR Screening     Status: None   Collection Time: 09/20/14  4:34 PM  Result Value Ref Range Status   MRSA by PCR NEGATIVE NEGATIVE Final    Comment:        The GeneXpert MRSA Assay (FDA approved for NASAL specimens only), is one component of a comprehensive MRSA colonization surveillance program. It is not intended to diagnose MRSA infection nor to guide  or monitor treatment for MRSA infections.      Studies:  Recent x-ray studies have been reviewed in detail by the Attending Physician  Scheduled Meds:  Scheduled Meds: . sodium chloride   Intravenous Once  . allopurinol  200 mg Oral Daily  . aspirin EC  81 mg Oral QPM  . dipyridamole-aspirin  1 capsule Oral BID  . docusate sodium  100 mg Oral BID  . heparin  5,000 Units Subcutaneous 3 times per day  . insulin aspart  0-9 Units Subcutaneous TID WC  . pantoprazole  40 mg Oral Daily  . rosuvastatin  20 mg Oral QPM    Time spent on care of this patient: 35 mins   Annie Roseboom T , MD   [facility] Office  [phone] Pager - Text Page per Shea Evans as per below:  On-Call/Text Page:      [email or web address]      password [email or web address]  If 7PM-7AM, please contact night-coverage [email or web address] Password [email or web address] 09/21/2014, 11:55 AM   LOS: 1 day

## 2014-09-21 NOTE — Progress Notes (Signed)
CRITICAL VALUE ALERT  Critical value received:  Hgb 6.8  Date of notification:  09/21/2014  Time of notification:  0400  Critical value read back:Yes.    Nurse who received alert:  L. Morene Rankins  MD notified (1st page):  Fredirick Maudlin  Time of first page:  0430  MD notified (2nd page):  Time of second page:  Responding MD: Fredirick Maudlin Time MD responded:  [phone]

## 2014-09-21 NOTE — Progress Notes (Signed)
INITIAL NUTRITION ASSESSMENT  DOCUMENTATION CODES Per approved criteria  -Not Applicable   INTERVENTION: -Ensure Complete po BID, each supplement provides 350 kcal and 13 grams of protein -Provide chopped meat for ease of intake  NUTRITION DIAGNOSIS: Inadequate oral intake related to loss of appetite, masticatory difficulty as evidenced by progressive wt loss, diet hx.   Goal: Pt will meet >90% of estimated nutritional needs  Monitor:  PO intake, labs, weight changes, I/O's  Reason for Assessment: Consult to assess needs  81 y.o. male  Admitting Dx: Hypothermia  81 y.o. male with DM, HTN, CKD, recently diagnosed with malignant lesion in liver who fell while at home. Got up from bed to walk to bathroom, became dizzy and fell. In ED, initial rectal temperature 93 degrees. CBG 31 prior to presentation. Blood glucose in ED normal. WBC 16,000. hgb 9. Creatinine 3.6, about 2.6 2 months prior. CXR, UAwithout evidence of infection. Patient denied cough, dysuria, rash. Endorsed poor appetite and 10 lb weight loss over the past month. Has yet to f/u with Oncology.   ASSESSMENT: Pt admitted with hypothermia. Hx obtained by pt, pt daughter, and pt son-in-law at bedside. All confirm poor po intake and weight loss over the past month. Intake has improved during hospitalization, however, is still not optimal. Pt reports that he has eaten 50% of meals during hospitalization, which is confirmed by intake data.  He reports his largest barriers to eating are his altered taste perception ("food just tastes funny now") and difficulty chewing meats. He reports his steak was too hard last night and it takes a lot of effort to chew harder foods, however, did well with chopped meat with gravy this afternoon. Daughter reports she was offering him soft foods at home, such as soup and canned fruit, to promote intake. He is agreeable to receiving chopped meats for ease of intake.  He confirms UBW of  195-200#. He reports a 10# wt loss over the past 1-2 months. Documented wt hx reveals a 4.6% wt loss over the past 3 months.  Educated pt on importance of good intake of meals and supplements to promote healing. He is amenable to try Ensure- reports his PCP suggested he try it when he began losing wt.  Labs reviewed. BUN/Creat: 54/3.31, Calcium: 8.2, CBGS: 64-111. Height: Ht Readings from Last 1 Encounters:  09/20/14 5\' 11"  (1.803 m)    Weight: Wt Readings from Last 1 Encounters:  09/21/14 186 lb 15.2 oz (84.8 kg)    Ideal Body Weight: 172#  % Ideal Body Weight: 108%  Wt Readings from Last 10 Encounters:  09/21/14 186 lb 15.2 oz (84.8 kg)  09/12/14 184 lb (83.462 kg)  09/03/14 184 lb (83.462 kg)  06/17/14 195 lb (88.451 kg)  12/18/13 193 lb 3.2 oz (87.635 kg)  11/19/13 191 lb (86.637 kg)  06/28/13 194 lb 12.8 oz (88.361 kg)  12/22/12 202 lb (91.627 kg)  06/30/11 205 lb (92.987 kg)  05/31/11 205 lb 12.8 oz (93.35 kg)    Usual Body Weight: 195#  % Usual Body Weight: 95%  BMI:  Body mass index is 26.09 kg/(m^2). Overweight  Estimated Nutritional Needs: Kcal: 2000-2200 Protein: 102-112 grams Fluid: 2.0-2.2 L  Skin: stage II pressure ulcer on mid sacrum, multiple abrasions  Diet Order: Diet regular  EDUCATION NEEDS: -Education needs addressed   Intake/Output Summary (Last 24 hours) at 09/21/14 1449 Last data filed at 09/21/14 1134  Gross per 24 hour  Intake   1800 ml  Output  825 ml  Net    975 ml    Last BM: PTA  Labs:   Recent Labs Lab 09/20/14 1150 09/21/14 0233  NA 139 137  K 4.2 4.3  CL 107 111  CO2 19 19  BUN 57* 54*  CREATININE 3.59* 3.31*  CALCIUM 9.3 8.2*  GLUCOSE 103* 81    CBG (last 3)   Recent Labs  09/21/14 0809 09/21/14 0930 09/21/14 1226  GLUCAP 64* 88 111*    Scheduled Meds: . allopurinol  200 mg Oral Daily  . aspirin EC  81 mg Oral QPM  . dipyridamole-aspirin  1 capsule Oral BID  . docusate sodium  100 mg Oral BID   . heparin  5,000 Units Subcutaneous 3 times per day  . insulin aspart  0-9 Units Subcutaneous TID WC  . metoprolol succinate  25 mg Oral Daily  . pantoprazole  40 mg Oral Daily  . polyethylene glycol  17 g Oral Daily  . sodium bicarbonate  650 mg Oral BID    Continuous Infusions: . sodium chloride 75 mL/hr at 09/21/14 1444    Past Medical History  Diagnosis Date  . Type II or unspecified type diabetes mellitus without mention of complication, not stated as uncontrolled   . Hypertension   . Hyperlipemia   . Prostatitis   . PVC (premature ventricular contraction)   . Unspecified gastritis and gastroduodenitis without mention of hemorrhage   . Anemia   . Diverticulosis of colon (without mention of hemorrhage)   . Internal hemorrhoids without mention of complication   . Hepatitis A 1953  . Gout   . Hypogonadism male   . Aortic stenosis   . Esophageal reflux   . Renal insufficiency   . Degenerative arthritis of left knee   . Elevated prostate specific antigen (PSA)   . Other symptoms involving cardiovascular system   . Cardiac arrhythmia due to congenital heart disease   . Arthritis     Left Knee  . Stroke 2009  . Cataract     BILATERAL REMOVED  . TIA (transient ischemic attack)   . Elevated LFTs   . Chronic renal failure   . GERD (gastroesophageal reflux disease)   . Tubular adenoma of colon   . Cancer     Past Surgical History  Procedure Laterality Date  . Inguinal hernia repair      bilateral  . Cataract extraction w/ intraocular lens  implant, bilateral Bilateral 2011, 2012  . Colonoscopy    . Carotid doppler  05/11/2012    Doppler velocities suggest less than 40% stenosis of the bilateral proximal interal carotid arteries  . Cardiovascular stress test  12/22/2012    Possible mid to basal inferior infarction, but no evidence of significant ischemia  . Transthoracic echocardiogram  12/22/2012    EF not noted, otherwise normal-mild  . Anterior fusion lumbar spine       Mikie Misner A. Jimmye Norman, RD, LDN, CDE Pager: [phone] After hours Pager: [phone]

## 2014-09-21 NOTE — Evaluation (Signed)
Physical Therapy Evaluation Patient Details Name: Eduardo Hubbard MRN: [id number] DOB: 20-Jul-1927 Today's Date: 09/21/2014   History of Present Illness  Pt is 81 y.o. male with  the following medical history DM, HTN, CKD and recently diagnosed with malignant lesion in liver who fell while at home.  Pt admitted with hypothermia and anemia.   Clinical Impression  Pt admitted with above diagnosis. Pt moving independently and no PT needs at this time.  Encouraged pt to ambulate with RN staff and to eat all meals in chair.  RN aware.   Follow Up Recommendations No PT follow up    Equipment Recommendations  None recommended by PT    Recommendations for Other Services       Precautions / Restrictions Precautions Precautions: Fall      Mobility  Bed Mobility                  Transfers Overall transfer level: Independent Equipment used: None                Ambulation/Gait Ambulation/Gait assistance: Modified independent (Device/Increase time) Ambulation Distance (Feet): 150 Feet Assistive device:  (Pt using IV pole to stabilize) Gait Pattern/deviations: WFL(Within Functional Limits)        Stairs            Wheelchair Mobility    Modified Rankin (Stroke Patients Only)       Balance                                             Pertinent Vitals/Pain Pain Assessment: No/denies pain    Home Living Family/patient expects to be discharged to:: Private residence Living Arrangements: Alone Available Help at Discharge: Family;Available PRN/intermittently Type of Home: House Home Access: Stairs to enter Entrance Stairs-Rails: Can reach both;Right;Left Entrance Stairs-Number of Steps: 3 Home Layout: One level Home Equipment: Cane - single point      Prior Function Level of Independence: Independent               Hand Dominance   Dominant Hand: Right    Extremity/Trunk Assessment               Lower Extremity  Assessment: Overall WFL for tasks assessed         Communication   Communication: No difficulties  Cognition Arousal/Alertness: Awake/alert Behavior During Therapy: WFL for tasks assessed/performed Overall Cognitive Status: Within Functional Limits for tasks assessed                      General Comments      Exercises        Assessment/Plan    PT Assessment Patent does not need any further PT services  PT Diagnosis     PT Problem List    PT Treatment Interventions     PT Goals (Current goals can be found in the Care Plan section)      Frequency     Barriers to discharge        Co-evaluation               End of Session Equipment Utilized During Treatment: Gait belt Activity Tolerance: Patient tolerated treatment well Patient left: in chair;with call bell/phone within reach;with family/visitor present Nurse Communication: Mobility status         Time: 1405-1430 PT Time Calculation (min) (ACUTE  ONLY): 25 min   Charges:   PT Evaluation $Initial PT Evaluation Tier I: 1 Procedure PT Treatments $Gait Training: 23-37 mins   PT G Codes:        Bueford Arp 09/29/14, 2:46 PM  Antoine Poche, [place] DPT [phone]

## 2014-09-22 DIAGNOSIS — I350 Nonrheumatic aortic (valve) stenosis: Secondary | ICD-10-CM

## 2014-09-22 LAB — GLUCOSE, CAPILLARY
GLUCOSE-CAPILLARY: 102 mg/dL — AB (ref 70–99)
GLUCOSE-CAPILLARY: 136 mg/dL — AB (ref 70–99)
GLUCOSE-CAPILLARY: 171 mg/dL — AB (ref 70–99)
Glucose-Capillary: 134 mg/dL — ABNORMAL HIGH (ref 70–99)

## 2014-09-22 LAB — COMPREHENSIVE METABOLIC PANEL
ALK PHOS: 202 U/L — AB (ref 39–117)
ALT: 25 U/L (ref 0–53)
AST: 33 U/L (ref 0–37)
Albumin: 2.2 g/dL — ABNORMAL LOW (ref 3.5–5.2)
Anion gap: 5 (ref 5–15)
BILIRUBIN TOTAL: 0.8 mg/dL (ref 0.3–1.2)
BUN: 46 mg/dL — ABNORMAL HIGH (ref 6–23)
CALCIUM: 8.4 mg/dL (ref 8.4–10.5)
CO2: 22 mmol/L (ref 19–32)
CREATININE: 2.92 mg/dL — AB (ref 0.50–1.35)
Chloride: 113 mEq/L — ABNORMAL HIGH (ref 96–112)
GFR calc Af Amer: 21 mL/min — ABNORMAL LOW (ref >90)
GFR calc non Af Amer: 18 mL/min — ABNORMAL LOW (ref >90)
GLUCOSE: 68 mg/dL — AB (ref 70–99)
Potassium: 4.6 mmol/L (ref 3.5–5.1)
Sodium: 140 mmol/L (ref 135–145)
TOTAL PROTEIN: 4.8 g/dL — AB (ref 6.0–8.3)

## 2014-09-22 LAB — CBC
HCT: 24.3 % — ABNORMAL LOW (ref 39.0–52.0)
HEMOGLOBIN: 7.9 g/dL — AB (ref 13.0–17.0)
MCH: 30.7 pg (ref 26.0–34.0)
MCHC: 32.5 g/dL (ref 30.0–36.0)
MCV: 94.6 fL (ref 78.0–100.0)
Platelets: 306 10*3/uL (ref 150–400)
RBC: 2.57 MIL/uL — ABNORMAL LOW (ref 4.22–5.81)
RDW: 16 % — AB (ref 11.5–15.5)
WBC: 12.2 10*3/uL — ABNORMAL HIGH (ref 4.0–10.5)

## 2014-09-22 LAB — TYPE AND SCREEN
ABO/RH(D): A POS
Antibody Screen: NEGATIVE
UNIT DIVISION: 0

## 2014-09-22 LAB — APTT: APTT: 38 s — AB (ref 24–37)

## 2014-09-22 LAB — PROTIME-INR
INR: 1.23 (ref 0.00–1.49)
PROTHROMBIN TIME: 15.7 s — AB (ref 11.6–15.2)

## 2014-09-22 NOTE — Progress Notes (Signed)
[place] TEAM 1 - Stepdown/ICU TEAM Progress Note  Eduardo Hubbard UVJ:[id number] DOB: 1927/06/10 DOA: 09/20/2014 PCP: Marylene Land, MD   Admit HPI / Brief Narrative: 81 y.o. male with DM, HTN, CKD, recently diagnosed with malignant lesion in liver who fell while at home. Got up from bed to walk to bathroom, became dizzy and fell. In ED, initial rectal temperature 93 degrees. CBG 31 prior to presentation. Blood glucose in ED normal. WBC 16,000. Hgb 9. Creatinine 3.6, about 2.62 months prior. CXR, UAwithout evidence of infection. Patient denied cough, dysuria, rash. Endorsed poor appetite and 10 lb weight loss over the past month. Has yet to f/u with Oncology.   HPI/Subjective: Pt in bedside chair w/ no complaints.  Denies cp, sob, n/v, or abdom pain.    Assessment/Plan:  Hypothermia No clear evidence of infection - likely related to severe hypoglycemia - one dip in temp to 96.9 oral in last 24hrs - cont to follow    DM2 w/ severe hypoglycemia likely due to ongoing use of glyburide w/ progressive renal failure - CBG much improved/stabilized - cont to hold DM meds   Acute renal failure on CKD stage IV crt 2.18 Jun 2014 - 3.6 at presentation - pt has been loosing weight w/ poor appetite - likely prerenal azotemia +/- contribution from ARB - no ARB/ACE - hydrate - crt is improving   HTN BP currently well controlled/borderline low - cont to hold usual BP meds except for BB   Mild AoS  Carcinoma of liver outpt Oncology eval planned (pt currently awaiting appointment) - US guided bx 12/31 in IR w/ path revealing metastatic poorly differentiated carcinoma   Anemia  Likely due to kidney disease, CA, and poor nutritional state - Fe studies suggest ACD - transfused 1U PRBC thus far - follow Hgb   Code Status: FULL Family Communication: no family present at time of exam  Disposition Plan: SDU  Consultants: none  Procedures: none  Antibiotics: none  DVT prophylaxis: SQ  heparin   Objective: Blood pressure 104/43, pulse 59, temperature 99.5 F (37.5 C), temperature source Oral, resp. rate 18, height 5\' 11"  (1.803 m), weight 83.5 kg (184 lb 1.4 oz), SpO2 97 %.  Intake/Output Summary (Last 24 hours) at 09/22/14 1727 Last data filed at 09/22/14 0600  Gross per 24 hour  Intake    900 ml  Output    850 ml  Net     50 ml   Exam: General: No acute respiratory distress Lungs: Clear to auscultation bilaterally without wheezes or crackles Cardiovascular: Regular rate and rhythm without gallop or rub - 2/6 holosystolic M Abdomen: Nontender, nondistended, soft, bowel sounds positive, no rebound, no ascites, no appreciable mass Extremities: No significant cyanosis, clubbing, or edema bilateral lower extremities  Data Reviewed: Basic Metabolic Panel:  Recent Labs Lab 09/20/14 1150 09/21/14 0233 09/22/14 0305  NA 139 137 140  K 4.2 4.3 4.6  CL 107 111 113*  CO2 19 19 22   GLUCOSE 103* 81 68*  BUN 57* 54* 46*  CREATININE 3.59* 3.31* 2.92*  CALCIUM 9.3 8.2* 8.4    Liver Function Tests:  Recent Labs Lab 09/20/14 1150 09/22/14 0305  AST 42* 33  ALT 33 25  ALKPHOS 267* 202*  BILITOT 0.6 0.8  PROT 6.3 4.8*  ALBUMIN 2.9* 2.2*   Coags:  Recent Labs Lab 09/22/14 0305  INR 1.23    Recent Labs Lab 09/22/14 0305  APTT 38*    CBC:  Recent Labs Lab  09/20/14 1150 09/21/14 0233 09/22/14 0305  WBC 16.0* 11.7* 12.2*  NEUTROABS 14.6* 9.2*  --   HGB 9.3* 6.8* 7.9*  HCT 28.1* 20.5* 24.3*  MCV 96.2 96.2 94.6  PLT 401* 324 306   CBG:  Recent Labs Lab 09/21/14 1540 09/21/14 2124 09/22/14 0924 09/22/14 1120 09/22/14 1551  GLUCAP 142* 98 102* 136* 171*    Recent Results (from the past 240 hour(s))  Urine culture     Status: None   Collection Time: 09/20/14  1:00 PM  Result Value Ref Range Status   Specimen Description URINE, CLEAN CATCH  Final   Special Requests NONE  Final   Colony Count   Final    2,000 COLONIES/ML Performed  at [facility]    Culture   Final    INSIGNIFICANT GROWTH Performed at [facility]    Report Status 09/21/2014 FINAL  Final  Blood Culture (routine x 2)     Status: None (Preliminary result)   Collection Time: 09/20/14  1:30 PM  Result Value Ref Range Status   Specimen Description BLOOD HAND RIGHT  Final   Special Requests BOTTLES DRAWN AEROBIC ONLY HAND RIGHT  Final   Culture   Final           BLOOD CULTURE RECEIVED NO GROWTH TO DATE CULTURE WILL BE HELD FOR 5 DAYS BEFORE ISSUING A FINAL NEGATIVE REPORT Performed at [facility]    Report Status PENDING  Incomplete  Blood Culture (routine x 2)     Status: None (Preliminary result)   Collection Time: 09/20/14  2:36 PM  Result Value Ref Range Status   Specimen Description BLOOD LEFT GROIN  Final   Special Requests BOTTLES DRAWN AEROBIC AND ANAEROBIC 5CC  Final   Culture   Final           BLOOD CULTURE RECEIVED NO GROWTH TO DATE CULTURE WILL BE HELD FOR 5 DAYS BEFORE ISSUING A FINAL NEGATIVE REPORT Performed at [facility]    Report Status PENDING  Incomplete  MRSA PCR Screening     Status: None   Collection Time: 09/20/14  4:34 PM  Result Value Ref Range Status   MRSA by PCR NEGATIVE NEGATIVE Final    Comment:        The GeneXpert MRSA Assay (FDA approved for NASAL specimens only), is one component of a comprehensive MRSA colonization surveillance program. It is not intended to diagnose MRSA infection nor to guide or monitor treatment for MRSA infections.      Studies:  Recent x-ray studies have been reviewed in detail by the Attending Physician  Scheduled Meds:  Scheduled Meds: . allopurinol  200 mg Oral Daily  . aspirin EC  81 mg Oral QPM  . dipyridamole-aspirin  1 capsule Oral BID  . docusate sodium  100 mg Oral BID  . feeding supplement (ENSURE COMPLETE)  237 mL Oral BID BM  . heparin  5,000 Units Subcutaneous 3 times per day  . insulin aspart  0-9 Units Subcutaneous TID  WC  . metoprolol succinate  25 mg Oral Daily  . pantoprazole  40 mg Oral Daily  . polyethylene glycol  17 g Oral Daily  . sodium bicarbonate  650 mg Oral BID    Time spent on care of this patient: 35 mins   Yashvi Jasinski T , MD   [facility] Office  [phone] Pager - Text Page per Shea Evans as per below:  On-Call/Text Page:  [email or web address]      password [email or web address]  If 7PM-7AM, please contact night-coverage [email or web address] Password [email or web address] 09/22/2014, 5:27 PM   LOS: 2 days

## 2014-09-23 LAB — GLUCOSE, CAPILLARY: GLUCOSE-CAPILLARY: 76 mg/dL (ref 70–99)

## 2014-09-23 LAB — COMPREHENSIVE METABOLIC PANEL
ALT: 30 U/L (ref 0–53)
AST: 40 U/L — ABNORMAL HIGH (ref 0–37)
Albumin: 2.2 g/dL — ABNORMAL LOW (ref 3.5–5.2)
Alkaline Phosphatase: 235 U/L — ABNORMAL HIGH (ref 39–117)
Anion gap: 7 (ref 5–15)
BUN: 44 mg/dL — AB (ref 6–23)
CALCIUM: 8.4 mg/dL (ref 8.4–10.5)
CHLORIDE: 111 meq/L (ref 96–112)
CO2: 19 mmol/L (ref 19–32)
CREATININE: 2.76 mg/dL — AB (ref 0.50–1.35)
GFR calc Af Amer: 23 mL/min — ABNORMAL LOW (ref >90)
GFR, EST NON AFRICAN AMERICAN: 19 mL/min — AB (ref >90)
Glucose, Bld: 101 mg/dL — ABNORMAL HIGH (ref 70–99)
Potassium: 4.8 mmol/L (ref 3.5–5.1)
SODIUM: 137 mmol/L (ref 135–145)
Total Bilirubin: 0.7 mg/dL (ref 0.3–1.2)
Total Protein: 4.9 g/dL — ABNORMAL LOW (ref 6.0–8.3)

## 2014-09-23 LAB — CBC
HCT: 24.3 % — ABNORMAL LOW (ref 39.0–52.0)
HEMOGLOBIN: 8 g/dL — AB (ref 13.0–17.0)
MCH: 32.1 pg (ref 26.0–34.0)
MCHC: 32.9 g/dL (ref 30.0–36.0)
MCV: 97.6 fL (ref 78.0–100.0)
Platelets: 278 10*3/uL (ref 150–400)
RBC: 2.49 MIL/uL — ABNORMAL LOW (ref 4.22–5.81)
RDW: 15.5 % (ref 11.5–15.5)
WBC: 13.7 10*3/uL — AB (ref 4.0–10.5)

## 2014-09-23 MED ORDER — ACETAMINOPHEN 325 MG PO TABS: 325.0000 mg | ORAL_TABLET | Freq: Four times a day (QID) | ORAL | Status: DC | PRN

## 2014-09-23 MED ORDER — ALLOPURINOL 100 MG PO TABS: 200.0000 mg | ORAL_TABLET | Freq: Every day | ORAL | Status: DC

## 2014-09-23 MED ORDER — METOPROLOL SUCCINATE ER 25 MG PO TB24: 25.0000 mg | ORAL_TABLET | Freq: Every day | ORAL | Status: AC

## 2014-09-23 MED ORDER — SORBITOL 70 % SOLN
30.0000 mL | Freq: Once | Status: AC
  Administered 2014-09-23: 30 mL via ORAL
  Filled 2014-09-23: qty 30

## 2014-09-23 NOTE — Care Management Note (Signed)
    Page 1 of 1   09/23/2014     3:43:56 PM CARE MANAGEMENT NOTE 09/23/2014  Patient:  Eduardo Hubbard, Eduardo Hubbard   Account Number:  [id number]  Date Initiated:  09/23/2014  Documentation initiated by:  Marvetta Gibbons  Subjective/Objective Assessment:   Pt admitted with hypothermia and fall     Action/Plan:   PTA pt lived at home   Anticipated DC Date:  09/23/2014   Anticipated DC Plan:  HOME/SELF CARE         Choice offered to / List presented to:             Status of service:  Completed, signed off Medicare Important Message given?  YES (If response is "NO", the following Medicare IM given date fields will be blank) Date Medicare IM given:  09/23/2014 Medicare IM given by:  Marvetta Gibbons Date Additional Medicare IM given:   Additional Medicare IM given by:    Discharge Disposition:  HOME/SELF CARE  Per UR Regulation:  Reviewed for med. necessity/level of care/duration of stay  If discussed at [place] of Stay Meetings, dates discussed:    Comments:

## 2014-09-23 NOTE — Discharge Summary (Signed)
DISCHARGE SUMMARY  Eduardo Hubbard  MR#: [id number]  DOB:[birth year]  Date of Admission: 09/20/2014 Date of Discharge: 09/23/2014  Attending Physician:MCCLUNG,JEFFREY T  Patient's FVC:BSWHQPRF,FMBWG F, MD  Consults: none  Disposition: D/C home   Follow-up Appts:     Follow-up Information    Follow up with Marylene Land, MD. Schedule an appointment as soon as possible for a visit in 1 week.   Specialty:  Family Medicine   Contact information:   [place] [address] [phone]       Follow up with Truitt Merle, MD On 09/26/2014.   Specialties:  Hematology, Oncology   Why:  1:30PM    Contact information:   [place] [address] [phone]      Tests Needing Follow-up: - monitoring of CBG is suggested as Januvia may need to be resumed  - recheck of renal function in 5-7 days is suggested  - recheck of BP is suggested w/ discontinuation of his usual BP meds  Discharge Diagnoses: Hypothermia DM2 w/ severe hypoglycemia Acute renal failure on CKD stage IV HTN Mild AoS Carcinoma of liver Anemia   Initial presentation: 81 y.o. male with DM, HTN, CKD, recently diagnosed with malignant lesion in liver who fell while at home. Got up from bed to walk to bathroom, became dizzy and fell. In ED, initial rectal temperature 93 degrees. CBG 31 prior to presentation. Blood glucose in ED normal. WBC 16,000. Hgb 9. Creatinine 3.6, about 2.62 months prior. CXR, UAwithout evidence of infection. Patient denied cough, dysuria, rash. Endorsed poor appetite and 10 lb weight loss over the past month. Has yet to f/u with Oncology.   Hospital Course: Pt was admitted, hydrated, and dosed w/ IV dextrose.  His acute renal failure responded to IVF, and his hypoglycemia resolved with d/c of his glyburide and januvia, and with improvement in his renal function.  It is felt that his poor oral intake led to pre-renal azotemia, causing him to retain his DM  meds longer than usual, leading to his severe hypoglycemia.  Hypothermia No clear evidence of infection - likely related to severe hypoglycemia - one dip in temp to 96.9 during hospital stay but no further dips in 24hrs prior to d/c   DM2 w/ severe hypoglycemia likely due to ongoing use of glyburide w/ progressive renal failure - CBG much improved/stabilized - cont to hold DM meds for now   Acute renal failure on CKD stage IV crt 2.18 Jun 2014 - 3.6 at presentation - pt has been loosing weight w/ poor appetite - likely prerenal azotemia +/- contribution from ARB - no ARB/ACE - hydrate - crt is improved to near baseline at time of d/c   HTN BP currently well controlled/borderline low - cont to hold usual BP meds except for BB   Mild AoS  Carcinoma of liver outpt Oncology eval planned - US guided bx 12/31 in IR w/ path revealing metastatic poorly differentiated carcinoma - to see Dr. Burr Medico on Thursday 09/26/14 at 1:30pm (I called office to confirm)  Anemia  Likely due to kidney disease, CA, and poor nutritional state - Fe studies suggest ACD - transfused 1U PRBC this admit - Hgb stable at 8     Medication List    STOP taking these medications        glyBURIDE 2.5 MG tablet  Commonly known as:  DIABETA     rosuvastatin 40 MG tablet  Commonly known as:  CRESTOR  sitaGLIPtin 100 MG tablet  Commonly known as:  JANUVIA      TAKE these medications        acetaminophen 325 MG tablet  Commonly known as:  TYLENOL  Take 1 tablet (325 mg total) by mouth every 6 (six) hours as needed for mild pain or fever.     AGGRENOX 200-25 MG per 12 hr capsule  Generic drug:  dipyridamole-aspirin  Take 1 capsule by mouth 2 (two) times daily.     allopurinol 100 MG tablet  Commonly known as:  ZYLOPRIM  Take 2 tablets (200 mg total) by mouth daily.     aspirin EC 81 MG tablet  Take 81 mg by mouth every evening.     cholecalciferol 1000 UNITS tablet  Commonly known as:  VITAMIN D  Take  1,000 Units by mouth daily.     metoprolol succinate 25 MG 24 hr tablet  Commonly known as:  TOPROL-XL  Take 1 tablet (25 mg total) by mouth daily.     omeprazole 20 MG tablet  Commonly known as:  PRILOSEC OTC  Take 20 mg by mouth daily.     oxyCODONE 5 MG immediate release tablet  Commonly known as:  Oxy IR/ROXICODONE  Take 5-10 mg by mouth every 4 (four) hours as needed for severe pain.     PRESERVISION AREDS PO  Take 1 tablet by mouth 2 (two) times daily.     sodium bicarbonate 650 MG tablet  Take 650 mg by mouth 2 (two) times daily.        Day of Discharge BP 134/59 mmHg  Pulse 62  Temp(Src) 98.1 F (36.7 C) (Oral)  Resp 16  Ht 5\' 11"  (1.803 m)  Wt 85.1 kg (187 lb 9.8 oz)  BMI 26.18 kg/m2  SpO2 98%  Physical Exam: General: No acute respiratory distress Lungs: Clear to auscultation bilaterally without wheezes or crackles Cardiovascular: Regular rate and rhythm without gallop or rub - 2/6 holosystolic M Abdomen: Nontender, nondistended, soft, bowel sounds positive, no rebound, no ascites, no appreciable mass Extremities: No significant cyanosis, clubbing, or edema bilateral lower extremities  Basic Metabolic Panel:  Recent Labs Lab 09/20/14 1150 09/21/14 0233 09/22/14 0305 09/23/14 0221  NA 139 137 140 137  K 4.2 4.3 4.6 4.8  CL 107 111 113* 111  CO2 19 19 22 19   GLUCOSE 103* 81 68* 101*  BUN 57* 54* 46* 44*  CREATININE 3.59* 3.31* 2.92* 2.76*  CALCIUM 9.3 8.2* 8.4 8.4    Liver Function Tests:  Recent Labs Lab 09/20/14 1150 09/22/14 0305 09/23/14 0221  AST 42* 33 40*  ALT 33 25 30  ALKPHOS 267* 202* 235*  BILITOT 0.6 0.8 0.7  PROT 6.3 4.8* 4.9*  ALBUMIN 2.9* 2.2* 2.2*   Coags:  Recent Labs Lab 09/22/14 0305  INR 1.23   CBC:  Recent Labs Lab 09/20/14 1150 09/21/14 0233 09/22/14 0305 09/23/14 0221  WBC 16.0* 11.7* 12.2* 13.7*  NEUTROABS 14.6* 9.2*  --   --   HGB 9.3* 6.8* 7.9* 8.0*  HCT 28.1* 20.5* 24.3* 24.3*  MCV 96.2 96.2  94.6 97.6  PLT 401* 324 306 278    CBG:  Recent Labs Lab 09/22/14 0924 09/22/14 1120 09/22/14 1551 09/22/14 2113 09/23/14 0743  GLUCAP 102* 136* 171* 134* 76    Recent Results (from the past 240 hour(s))  Urine culture     Status: None   Collection Time: 09/20/14  1:00 PM  Result Value Ref Range Status  Specimen Description URINE, CLEAN CATCH  Final   Special Requests NONE  Final   Colony Count   Final    2,000 COLONIES/ML Performed at [facility]    Culture   Final    INSIGNIFICANT GROWTH Performed at [facility]    Report Status 09/21/2014 FINAL  Final  Blood Culture (routine x 2)     Status: None (Preliminary result)   Collection Time: 09/20/14  1:30 PM  Result Value Ref Range Status   Specimen Description BLOOD HAND RIGHT  Final   Special Requests BOTTLES DRAWN AEROBIC ONLY HAND RIGHT  Final   Culture   Final           BLOOD CULTURE RECEIVED NO GROWTH TO DATE CULTURE WILL BE HELD FOR 5 DAYS BEFORE ISSUING A FINAL NEGATIVE REPORT Performed at [facility]    Report Status PENDING  Incomplete  Blood Culture (routine x 2)     Status: None (Preliminary result)   Collection Time: 09/20/14  2:36 PM  Result Value Ref Range Status   Specimen Description BLOOD LEFT GROIN  Final   Special Requests BOTTLES DRAWN AEROBIC AND ANAEROBIC 5CC  Final   Culture   Final           BLOOD CULTURE RECEIVED NO GROWTH TO DATE CULTURE WILL BE HELD FOR 5 DAYS BEFORE ISSUING A FINAL NEGATIVE REPORT Performed at [facility]    Report Status PENDING  Incomplete  MRSA PCR Screening     Status: None   Collection Time: 09/20/14  4:34 PM  Result Value Ref Range Status   MRSA by PCR NEGATIVE NEGATIVE Final    Comment:        The GeneXpert MRSA Assay (FDA approved for NASAL specimens only), is one component of a comprehensive MRSA colonization surveillance program. It is not intended to diagnose MRSA infection nor to guide or monitor treatment  for MRSA infections.     Time spent in discharge (includes decision making & examination of pt): >30  minutes  09/23/2014, 2:54 PM   Cherene Altes, MD [facility] Office  [phone] Pager [phone]  On-Call/Text Page:      [email or web address]      password [facility]

## 2014-09-23 NOTE — Progress Notes (Signed)
Utilization review completed. Wilbern Pennypacker, RN, BSN. 

## 2014-09-23 NOTE — Discharge Instructions (Signed)
Hypoglycemia °Hypoglycemia occurs when the glucose in your blood is too low. Glucose is a type of sugar that is your body's main energy source. Hormones, such as insulin and glucagon, control the level of glucose in the blood. Insulin lowers blood glucose and glucagon increases blood glucose. Having too much insulin in your blood stream, or not eating enough food containing sugar, can result in hypoglycemia. Hypoglycemia can happen to people with or without diabetes. It can develop quickly and can be a medical emergency.  °CAUSES  °· Missing or delaying meals. °· Not eating enough carbohydrates at meals. °· Taking too much diabetes medicine. °· Not timing your oral diabetes medicine or insulin doses with meals, snacks, and exercise. °· Nausea and vomiting. °· Certain medicines. °· Severe illnesses, such as hepatitis, kidney disorders, and certain eating disorders. °· Increased activity or exercise without eating something extra or adjusting medicines. °· Drinking too much alcohol. °· A nerve disorder that affects body functions like your heart rate, blood pressure, and digestion (autonomic neuropathy). °· A condition where the stomach muscles do not function properly (gastroparesis). Therefore, medicines and food may not absorb properly. °· Rarely, a tumor of the pancreas can produce too much insulin. °SYMPTOMS  °· Hunger. °· Sweating (diaphoresis). °· Change in body temperature. °· Shakiness. °· Headache. °· Anxiety. °· Lightheadedness. °· Irritability. °· Difficulty concentrating. °· Dry mouth. °· Tingling or numbness in the hands or feet. °· Restless sleep or sleep disturbances. °· Altered speech and coordination. °· Change in mental status. °· Seizures or prolonged convulsions. °· Combativeness. °· Drowsiness (lethargic). °· Weakness. °· Increased heart rate or palpitations. °· Confusion. °· Pale, gray skin color. °· Blurred or double vision. °· Fainting. °DIAGNOSIS  °A physical exam and medical history will be  performed. Your caregiver may make a diagnosis based on your symptoms. Blood tests and other lab tests may be performed to confirm a diagnosis. Once the diagnosis is made, your caregiver will see if your signs and symptoms go away once your blood glucose is raised.  °TREATMENT  °Usually, you can easily treat your hypoglycemia when you notice symptoms. °· Check your blood glucose. If it is less than 70 mg/dl, take one of the following:   °¨ 3-4 glucose tablets.   °¨ ½ cup juice.   °¨ ½ cup regular soda.   °¨ 1 cup skim milk.   °¨ ½-1 tube of glucose gel.   °¨ 5-6 hard candies.   °· Avoid high-fat drinks or food that may delay a rise in blood glucose levels. °· Do not take more than the recommended amount of sugary foods, drinks, gel, or tablets. Doing so will cause your blood glucose to go too high.   °· Wait 10-15 minutes and recheck your blood glucose. If it is still less than 70 mg/dl or below your target range, repeat treatment.   °· Eat a snack if it is more than 1 hour until your next meal.   °There may be a time when your blood glucose may go so low that you are unable to treat yourself at home when you start to notice symptoms. You may need someone to help you. You may even faint or be unable to swallow. If you cannot treat yourself, someone will need to bring you to the hospital.  °HOME CARE INSTRUCTIONS °· If you have diabetes, follow your diabetes management plan by: °¨ Taking your medicines as directed. °¨ Following your exercise plan. °¨ Following your meal plan. Do not skip meals. Eat on time. °¨ Testing your blood   glucose regularly. Check your blood glucose before and after exercise. If you exercise longer or different than usual, be sure to check blood glucose more frequently. °¨ Wearing your medical alert jewelry that says you have diabetes. °· Identify the cause of your hypoglycemia. Then, develop ways to prevent the recurrence of hypoglycemia. °· Do not take a hot bath or shower right after an  insulin shot. °· Always carry treatment with you. Glucose tablets are the easiest to carry. °· If you are going to drink alcohol, drink it only with meals. °· Tell friends or family members ways to keep you safe during a seizure. This may include removing hard or sharp objects from the area or turning you on your side. °· Maintain a healthy weight. °SEEK MEDICAL CARE IF:  °· You are having problems keeping your blood glucose in your target range. °· You are having frequent episodes of hypoglycemia. °· You feel you might be having side effects from your medicines. °· You are not sure why your blood glucose is dropping so low. °· You notice a change in vision or a new problem with your vision. °SEEK IMMEDIATE MEDICAL CARE IF:  °· Confusion develops. °· A change in mental status occurs. °· The inability to swallow develops. °· Fainting occurs. °Document Released: 08/30/2005 Document Revised: 09/04/2013 Document Reviewed: 12/27/2011 °ExitCare® Patient Information ©2015 [employer]. This information is not intended to replace advice given to you by your health care provider. Make sure you discuss any questions you have with your health care provider. ° °

## 2014-09-23 NOTE — Progress Notes (Signed)
Eduardo Hubbard to be D/C'd Home per MD order.  Discussed with the patient and all questions fully answered.    Medication List    STOP taking these medications        glyBURIDE 2.5 MG tablet  Commonly known as:  DIABETA     rosuvastatin 40 MG tablet  Commonly known as:  CRESTOR     sitaGLIPtin 100 MG tablet  Commonly known as:  JANUVIA      TAKE these medications        acetaminophen 325 MG tablet  Commonly known as:  TYLENOL  Take 1 tablet (325 mg total) by mouth every 6 (six) hours as needed for mild pain or fever.     AGGRENOX 200-25 MG per 12 hr capsule  Generic drug:  dipyridamole-aspirin  Take 1 capsule by mouth 2 (two) times daily.     allopurinol 100 MG tablet  Commonly known as:  ZYLOPRIM  Take 2 tablets (200 mg total) by mouth daily.     aspirin EC 81 MG tablet  Take 81 mg by mouth every evening.     cholecalciferol 1000 UNITS tablet  Commonly known as:  VITAMIN D  Take 1,000 Units by mouth daily.     metoprolol succinate 25 MG 24 hr tablet  Commonly known as:  TOPROL-XL  Take 1 tablet (25 mg total) by mouth daily.     omeprazole 20 MG tablet  Commonly known as:  PRILOSEC OTC  Take 20 mg by mouth daily.     oxyCODONE 5 MG immediate release tablet  Commonly known as:  Oxy IR/ROXICODONE  Take 5-10 mg by mouth every 4 (four) hours as needed for severe pain.     PRESERVISION AREDS PO  Take 1 tablet by mouth 2 (two) times daily.     sodium bicarbonate 650 MG tablet  Take 650 mg by mouth 2 (two) times daily.        VVS, Skin clean, dry and intact without evidence of skin break down, no evidence of skin tears noted. IV catheter discontinued intact. Site without signs and symptoms of complications. Dressing and pressure applied.  An After Visit Summary was printed and given to the patient.  D/c education completed with patient/family including follow up instructions, medication list, d/c activities limitations if indicated, with other d/c  instructions as indicated by MD - patient able to verbalize understanding, all questions fully answered.   Patient instructed to return to ED, call 911, or call MD for any changes in condition.   Patient escorted via [place], and D/C home via private auto.  Pecolia Ades [facility] 09/23/2014 4:45 PM

## 2014-09-23 NOTE — Progress Notes (Signed)
Medicare Important Message given? YES  (If response is "NO", the following Medicare IM given date fields will be blank)  Date Medicare IM given: 09/23/14 Medicare IM given by:  Dahlia Client [employer]

## 2014-09-24 LAB — GLUCOSE, CAPILLARY: GLUCOSE-CAPILLARY: 223 mg/dL — AB (ref 70–99)

## 2014-09-26 ENCOUNTER — Ambulatory Visit (HOSPITAL_COMMUNITY)
Admission: RE | Admit: 2014-09-26 | Discharge: 2014-09-26 | Disposition: A | Discharge status: Home / Self Care | Payer: Medicare Other | Source: Ambulatory Visit | Attending: Hematology | Admitting: Hematology

## 2014-09-26 ENCOUNTER — Telehealth: Payer: Self-pay | Admitting: *Deleted

## 2014-09-26 ENCOUNTER — Telehealth: Payer: Self-pay | Admitting: Hematology

## 2014-09-26 ENCOUNTER — Ambulatory Visit (HOSPITAL_BASED_OUTPATIENT_CLINIC_OR_DEPARTMENT_OTHER): Payer: Medicare Other | Admitting: Hematology

## 2014-09-26 ENCOUNTER — Other Ambulatory Visit: Payer: Self-pay | Admitting: *Deleted

## 2014-09-26 ENCOUNTER — Encounter: Payer: Self-pay | Admitting: Hematology

## 2014-09-26 ENCOUNTER — Ambulatory Visit: Payer: Medicare Other

## 2014-09-26 ENCOUNTER — Ambulatory Visit (HOSPITAL_BASED_OUTPATIENT_CLINIC_OR_DEPARTMENT_OTHER): Payer: Medicare Other

## 2014-09-26 ENCOUNTER — Other Ambulatory Visit (HOSPITAL_COMMUNITY): Payer: Self-pay | Admitting: *Deleted

## 2014-09-26 VITALS — BP 119/53 | HR 94 | Temp 98.3°F | Resp 18 | Ht 71.0 in | Wt 182.3 lb

## 2014-09-26 DIAGNOSIS — C221 Intrahepatic bile duct carcinoma: Secondary | ICD-10-CM

## 2014-09-26 DIAGNOSIS — D649 Anemia, unspecified: Secondary | ICD-10-CM

## 2014-09-26 DIAGNOSIS — C227 Other specified carcinomas of liver: Secondary | ICD-10-CM

## 2014-09-26 DIAGNOSIS — I10 Essential (primary) hypertension: Secondary | ICD-10-CM

## 2014-09-26 DIAGNOSIS — M7989 Other specified soft tissue disorders: Secondary | ICD-10-CM

## 2014-09-26 DIAGNOSIS — I350 Nonrheumatic aortic (valve) stenosis: Secondary | ICD-10-CM

## 2014-09-26 DIAGNOSIS — E119 Type 2 diabetes mellitus without complications: Secondary | ICD-10-CM

## 2014-09-26 DIAGNOSIS — N184 Chronic kidney disease, stage 4 (severe): Secondary | ICD-10-CM

## 2014-09-26 LAB — COMPREHENSIVE METABOLIC PANEL (CC13)
ALT: 49 U/L (ref 0–55)
AST: 60 U/L — ABNORMAL HIGH (ref 5–34)
Albumin: 2.7 g/dL — ABNORMAL LOW (ref 3.5–5.0)
Alkaline Phosphatase: 356 U/L — ABNORMAL HIGH (ref 40–150)
Anion Gap: 14 mEq/L — ABNORMAL HIGH (ref 3–11)
BUN: 50.1 mg/dL — ABNORMAL HIGH (ref 7.0–26.0)
CO2: 19 meq/L — AB (ref 22–29)
CREATININE: 2.8 mg/dL — AB (ref 0.7–1.3)
Calcium: 9.5 mg/dL (ref 8.4–10.4)
Chloride: 103 mEq/L (ref 98–109)
EGFR: 20 mL/min/{1.73_m2} — ABNORMAL LOW (ref >90)
Glucose: 187 mg/dl — ABNORMAL HIGH (ref 70–140)
Potassium: 5.3 mEq/L — ABNORMAL HIGH (ref 3.5–5.1)
Sodium: 137 mEq/L (ref 136–145)
Total Bilirubin: 0.78 mg/dL (ref 0.20–1.20)
Total Protein: 6.4 g/dL (ref 6.4–8.3)

## 2014-09-26 LAB — CBC & DIFF AND RETIC
BASO%: 0.2 % (ref 0.0–2.0)
Basophils Absolute: 0 10*3/uL (ref 0.0–0.1)
EOS%: 0.1 % (ref 0.0–7.0)
Eosinophils Absolute: 0 10*3/uL (ref 0.0–0.5)
HCT: 29.3 % — ABNORMAL LOW (ref 38.4–49.9)
HGB: 9.5 g/dL — ABNORMAL LOW (ref 13.0–17.1)
Immature Retic Fract: 12.7 % — ABNORMAL HIGH (ref 3.00–10.60)
LYMPH%: 5.9 % — ABNORMAL LOW (ref 14.0–49.0)
MCH: 32 pg (ref 27.2–33.4)
MCHC: 32.4 g/dL (ref 32.0–36.0)
MCV: 98.7 fL — ABNORMAL HIGH (ref 79.3–98.0)
MONO#: 1.4 10*3/uL — ABNORMAL HIGH (ref 0.1–0.9)
MONO%: 6.2 % (ref 0.0–14.0)
NEUT#: 19.5 10*3/uL — ABNORMAL HIGH (ref 1.5–6.5)
NEUT%: 87.6 % — ABNORMAL HIGH (ref 39.0–75.0)
Platelets: 297 10*3/uL (ref 140–400)
RBC: 2.97 10*6/uL — ABNORMAL LOW (ref 4.20–5.82)
RDW: 15.4 % — ABNORMAL HIGH (ref 11.0–14.6)
Retic %: 1.14 % (ref 0.80–1.80)
Retic Ct Abs: 33.86 10*3/uL — ABNORMAL LOW (ref 34.80–93.90)
WBC: 22.3 10*3/uL — ABNORMAL HIGH (ref 4.0–10.3)
lymph#: 1.3 10*3/uL (ref 0.9–3.3)

## 2014-09-26 LAB — CULTURE, BLOOD (ROUTINE X 2)
CULTURE: NO GROWTH
CULTURE: NO GROWTH

## 2014-09-26 LAB — ABO/RH: ABO/RH(D): A POS

## 2014-09-26 LAB — LACTATE DEHYDROGENASE (CC13): LDH: 593 U/L — ABNORMAL HIGH (ref 125–245)

## 2014-09-26 MED ORDER — MIRTAZAPINE 7.5 MG PO TABS: 7.5000 mg | ORAL_TABLET | Freq: Every day | ORAL | Status: AC

## 2014-09-26 NOTE — Telephone Encounter (Signed)
Received call from [facility] in vascular lab at [facility] re:  Pt is  Negative for  DVT.  Informed [name] that pt can go home.  Dr. Burr Medico notified.

## 2014-09-26 NOTE — Progress Notes (Signed)
*  PRELIMINARY RESULTS* Vascular Ultrasound Lower extremity venous duplex has been completed.  Preliminary findings: no evidence of DVT bilaterally.   Called results to RN for Dr. Burr Medico.   Landry Mellow, RDMS, RVT  09/26/2014, 5:46 PM

## 2014-09-26 NOTE — Progress Notes (Addendum)
[place]  [phone] [phone]  Clinic New Consult Note   Patient Care Team: Marylene Land, MD as PCP - General (Family Medicine) 09/26/2014  CHIEF COMPLAINTS/PURPOSE OF CONSULTATION:  Liver carcinoma   HISTORY OF PRESENTING ILLNESS:  Eduardo Hubbard 81 y.o. male is here because of newly diagnosed adenocarcinoma in liver.  He has been having worsening back and shoulder pain for 3-5 month. He did not seek medical attention because he has been taking care of his wife who had CHF and multiple hospitalization and rehab. He also report low appetite and dry mouth, he lost about 10 lbs in the past few months. He also had a few episodes of low grade fever and chills.   He was seen by PCP for routine follow up one month ago.His lab showed abnormal liver function and a CT of abdomen and chest was obtained on 08/29/2014, which showed a poorly defined abnormal lucency in the dome of the liver, concerning for tumor. Due to his chronic kidney disease, no IV contrast was given for the CT scan. He subsequently underwent a MRI of abdomen without IV contrast, which again showed the abnormal liver lesion in the dome. He underwent ultrasound-guided liver biopsy on 09/11/2014, which showed poorly differentiated carcinoma.  He was admitted to hospital for hypoglycemia and acute on chronic renal failure on 09/20/2014. He presents with dizziness and a fall at home. His insulin has been held since then. He was getting IV fluids and his renal function improved. He was also found to have moderate anemia, his hemoglobin was 8 on discharge.  He has moderate fatigue, able to do all his ADLs. He used to go out frequently for shopping and other activities, but has not been able to do those lately due to his fatigue. His back pain is stable, and he takes oxycodone as needed. His appetite is also low, eats small meals. No nausea, diarrhea or other symptoms.   MEDICAL HISTORY:    Past Medical History  Diagnosis Date  . Type II or unspecified type diabetes mellitus without mention of complication, not stated as uncontrolled   . Hypertension   . Hyperlipemia   . Prostatitis   . PVC (premature ventricular contraction)   . Unspecified gastritis and gastroduodenitis without mention of hemorrhage   . Anemia   . Diverticulosis of colon (without mention of hemorrhage)   . Internal hemorrhoids without mention of complication   . Hepatitis A 1953  . Gout   . Hypogonadism male   . Aortic stenosis   . Esophageal reflux   . Renal insufficiency   . Degenerative arthritis of left knee   . Elevated prostate specific antigen (PSA)   . Other symptoms involving cardiovascular system   . Cardiac arrhythmia due to congenital heart disease   . Arthritis     Left Knee  . Stroke 2009  . Cataract     BILATERAL REMOVED  . TIA (transient ischemic attack)   . Elevated LFTs   . Chronic renal failure   . GERD (gastroesophageal reflux disease)   . Tubular adenoma of colon   . Cancer     SURGICAL HISTORY: Past Surgical History  Procedure Laterality Date  . Inguinal hernia repair      bilateral  . Cataract extraction w/ intraocular lens  implant, bilateral Bilateral 2011, 2012  . Colonoscopy    . Carotid doppler  05/11/2012    Doppler velocities suggest less than 40% stenosis of the bilateral  proximal interal carotid arteries  . Cardiovascular stress test  12/22/2012    Possible mid to basal inferior infarction, but no evidence of significant ischemia  . Transthoracic echocardiogram  12/22/2012    EF not noted, otherwise normal-mild  . Anterior fusion lumbar spine      SOCIAL HISTORY: History   Social History  . Marital Status: Married    Spouse Name: N/A    Number of Children: 2  . Years of Education: N/A   Occupational History  . Industry    Social History Main Topics  . Smoking status: Never Smoker   . Smokeless tobacco: Never Used  . Alcohol Use: No  .  Drug Use: No  . Sexual Activity: Not on file   Other Topics Concern  . Not on file   Social History Narrative    FAMILY HISTORY: Family History  Problem Relation Age of Onset  . COPD Mother   . Diabetes Father   . Heart disease Father   . Throat cancer Brother     died age 5  . COPD Sister   . Diabetes Son   . Prostate cancer Brother     ALLERGIES:  is allergic to ciprofloxacin and contrast media.  MEDICATIONS:  Current Outpatient Prescriptions  Medication Sig Dispense Refill  . acetaminophen (TYLENOL) 325 MG tablet Take 1 tablet (325 mg total) by mouth every 6 (six) hours as needed for mild pain or fever.    . AGGRENOX 25-200 MG per 12 hr capsule Take 1 capsule by mouth 2 (two) times daily.    Marland Kitchen allopurinol (ZYLOPRIM) 100 MG tablet Take 2 tablets (200 mg total) by mouth daily. 60 tablet 0  . aspirin EC 81 MG tablet Take 81 mg by mouth every evening.    . cholecalciferol (VITAMIN D) 1000 UNITS tablet Take 1,000 Units by mouth daily.      . metoprolol succinate (TOPROL-XL) 25 MG 24 hr tablet Take 1 tablet (25 mg total) by mouth daily. 30 tablet 0  . Multiple Vitamins-Minerals (PRESERVISION AREDS PO) Take 1 tablet by mouth 2 (two) times daily.    Marland Kitchen omeprazole (PRILOSEC OTC) 20 MG tablet Take 20 mg by mouth daily.     Marland Kitchen oxyCODONE (OXY IR/ROXICODONE) 5 MG immediate release tablet Take 5-10 mg by mouth every 4 (four) hours as needed for severe pain.    . sodium bicarbonate 650 MG tablet Take 650 mg by mouth 2 (two) times daily.      No current facility-administered medications for this visit.    REVIEW OF SYSTEMS:   Constitutional: Denies fevers, chills or abnormal night sweats Eyes: Denies blurriness of vision, double vision or watery eyes Ears, nose, mouth, throat, and face: Denies mucositis or sore throat Respiratory: Denies cough, dyspnea or wheezes Cardiovascular: Denies palpitation, chest discomfort or lower extremity swelling Gastrointestinal:  Denies nausea,  heartburn or change in bowel habits Skin: Denies abnormal skin rashes Lymphatics: Denies new lymphadenopathy or easy bruising Neurological:Denies numbness, tingling or new weaknesses Behavioral/Psych: Mood is stable, no new changes  All other systems were reviewed with the patient and are negative.  PHYSICAL EXAMINATION: ECOG PERFORMANCE STATUS: 1 - Symptomatic but completely ambulatory  There were no vitals filed for this visit. There were no vitals filed for this visit.  GENERAL:alert, no distress and comfortable SKIN: skin color, texture, turgor are normal, no rashes or significant lesions EYES: normal, conjunctiva are pink and non-injected, sclera clear OROPHARYNX:no exudate, no erythema and lips, buccal mucosa, and tongue  normal  NECK: supple, thyroid normal size, non-tender, without nodularity LYMPH:  no palpable lymphadenopathy in the cervical, axillary or inguinal LUNGS: clear to auscultation and percussion with normal breathing effort HEART: regular rate & rhythm and no murmurs and no lower extremity edema ABDOMEN:abdomen soft, non-tender and normal bowel sounds, mild tenderness at upper abdomen. No rebound pain. No organomegaly. Musculoskeletal:no cyanosis of digits and no clubbing  PSYCH: alert & oriented x 3 with fluent speech NEURO: no focal motor/sensory deficits  LABORATORY DATA:  I have reviewed the data as listed Lab Results  Component Value Date   WBC 13.7* 09/23/2014   HGB 8.0* 09/23/2014   HCT 24.3* 09/23/2014   MCV 97.6 09/23/2014   PLT 278 09/23/2014    Recent Labs  09/20/14 1150 09/21/14 0233 09/22/14 0305 09/23/14 0221  NA 139 137 140 137  K 4.2 4.3 4.6 4.8  CL 107 111 113* 111  CO2 19 19 22 19   GLUCOSE 103* 81 68* 101*  BUN 57* 54* 46* 44*  CREATININE 3.59* 3.31* 2.92* 2.76*  CALCIUM 9.3 8.2* 8.4 8.4  GFRNONAA 14* 16* 18* 19*  GFRAA 16* 18* 21* 23*  PROT 6.3  --  4.8* 4.9*  ALBUMIN 2.9*  --  2.2* 2.2*  AST 42*  --  33 40*  ALT 33  --   25 30  ALKPHOS 267*  --  202* 235*  BILITOT 0.6  --  0.8 0.7   PATHOLOGY REPORT Diagnosis 09/12/2014  Liver, biopsy, Right - METASTATIC POORLY DIFFERENTIATED CARCINOMA. - SEE COMMENT. Microscopic Comment The malignant cells are positive for cytokeratin 7 and cytokeratin 20. A CD34 stain highlights the presence of interspersed blood vessels. AFP, CDX2, Hep Par 1, prostein, PSA and TTF-1 stains are negative. This immunohistochemical profile is nonspecific, however, tumors that are cytokeratin 7 positive as well as cytokeratin 20 positive include those of extrahepatic bile duct and gallbladder, bladder, pancreas, and upper GI tract. Radiologic correlation is necessary.  RADIOGRAPHIC STUDIES: I have personally reviewed the radiological images as listed and agreed with the findings in the report.  Ct Abdomen Pelvis Wo Contrast 08/29/2014    FINDINGS: There is inhomogeneous irregular density in the dome of the right lobe of the liver extending posteriorly as well as extending into the hepatic hilum and into the lateral aspect of the left lobe of the liver. There is some respiratory motion artifact but I am concerned that this represents an infiltrative tumor process.  No dilated bile ducts. There is a 4 mm calcification in the hepatic hilum which I think may represent a stone in the neck of the gallbladder. Gallbladder wall is not thickened.  Spleen, pancreas, and adrenal glands are normal. 3.3 cm cyst in the posterior aspect of the mid right kidney. 1.6 cm cyst on the lateral aspect of the upper pole of the left kidney. No hydronephrosis.  Multiple diverticula in the sigmoid portion of the colon. No diverticulitis. Bladder appears normal. Prostate gland is slightly enlarged.  No free air or free fluid. Extensive atheromatous disease in the abdominal aorta and common iliac arteries.  Previous lumbar fusion at L4-5 and L5-S1. No acute osseous abnormality.   IMPRESSION: Poorly defined abnormal lucency  in the dome of the liver worrisome for an infiltrating process such as tumor. There is some respiratory motion artifact but I think that the appearance is radial. This could be further assessed and characterized by abdominal MRI.  Possible solitary small gallstone.  Renal cysts.  Sigmoid diverticulosis.  Electronically Signed   By: Rozetta Nunnery M.D.   On: 08/29/2014 17:04   Dg Chest 2 View  09/03/2014   CLINICAL DATA:  Abnormal LFTs. Weight loss. Recent CT abdomen with question liver mass.  EXAM: CHEST  2 VIEW  COMPARISON:  CT abdomen/ pelvis 08/29/2014, chest radiographs 10/10/2012  FINDINGS: The heart size is normal, there is atherosclerosis of the thoracic aorta. The lungs are clear. Pulmonary vasculature is normal. No consolidation, pleural effusion, or pneumothorax. No acute osseous abnormalities are seen.  IMPRESSION: Atherosclerosis, no acute pulmonary process.   Electronically Signed   By: Jeb Levering M.D.   On: 09/03/2014 16:20    Mr Outside Films Head/face  09/11/2014   This examination belongs to an outside facility and is stored  here for comparison purposes only.  Contact the originating outside  institution for any associated report or interpretation.   ASSESSMENT & PLAN:  81 year old Caucasian male, with multiple comorbidities, including hypertension, diabetes, aortic stenosis, history of stroke and TIA, chronic kidney disease, who presents with abdominal pain, anorexia and weight loss. CT and MRI shows a suspicious liver lesion in the dome, biopsy confirmed poorly differentiated adenocarcinoma.  1. 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 -His liver biopsy showed poorly differentiated adenocarcinoma, CK 7 and CK 20 positive, which is consistent with adenocarcinoma. He has no other lesions in pancreas or other part of his abdomen. I spoke with the pathologist, his tumors morphology is suggestive for cholangioadenocarcinoma or pancreas  cancer, but his immunostain pattern is not specific. 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 [place] is also less likely giving he has no liver disease or cirrhosis. -I recommend to have a PET scan for further evaluation and extensive disease and also looking for other possible primary tumor. -I'll check his tumor markers including AFP, CEA and CA-19-9. -I'll present his case in our GI tumor board next week -We also discussed treatment options if this is a intrahepatic cholangiocarcinoma. Due to his advanced age, multiple comorbidities and tumor location, he is unlikely a good candidate for surgery. Due to his chronic kidney disease and advanced age, he is also a poor candidate for chemotherapy. I'll discuss with interventional radiology to see if liver targeted therapy such as ablation or embolization can be offered.  2. Anemia -His iron studies showed low iron, low TIBC and ferritin 240. His anemia is likely a combination off iron deficiency and chronic kidney disease. GI bleeding needs to be ruled out due to his recent diagnosis of adenocarcinoma in liver, unknown primary. -I'll arrange his IV Feraheme he IS into tomorrow -I spoke with his nephrologist Dr. Lorrene Reid who also suggests of Epo. I explained to patient that people may potentially stimulate tumor gross, and I usually do not suggest to use EPO unless the cancer is being treated definitively. -I'll repeat his CBC today, if hemoglobin remains to be below 8.5, I would suggest blood transfusion.  3. Type 2 diabetes, recent episode of hypoglycemia -He is being off insulin, needs to monitor his blood sugar closely at home. -Follow up was primary care physician  3.  Hypertension, aortic stenosis, CKD stage IV  -He will continue follow-up with his primary care physician and other specialists.    Plan -lab today: CBC, CMP, CEA, CA199, AFP  -PET scan  -GI TB  discussion -RTC in two weeks to finalize his treatment plan.  All questions were answered.  The patient knows to call the clinic with any problems, questions or concerns. I spent 55 minutes counseling the patient face to face. The total time spent in the appointment was 60 minutes and more than 50% was on counseling.     Truitt Merle, MD 09/26/2014 7:43 AM

## 2014-09-26 NOTE — Telephone Encounter (Signed)
Gave avs & cal for Jna/Feb.

## 2014-09-27 ENCOUNTER — Telehealth: Payer: Self-pay | Admitting: *Deleted

## 2014-09-27 ENCOUNTER — Encounter (HOSPITAL_COMMUNITY): Payer: Self-pay

## 2014-09-27 ENCOUNTER — Ambulatory Visit (HOSPITAL_BASED_OUTPATIENT_CLINIC_OR_DEPARTMENT_OTHER): Payer: Medicare Other

## 2014-09-27 DIAGNOSIS — D649 Anemia, unspecified: Secondary | ICD-10-CM

## 2014-09-27 DIAGNOSIS — D509 Iron deficiency anemia, unspecified: Secondary | ICD-10-CM

## 2014-09-27 DIAGNOSIS — N184 Chronic kidney disease, stage 4 (severe): Secondary | ICD-10-CM

## 2014-09-27 DIAGNOSIS — E611 Iron deficiency: Secondary | ICD-10-CM

## 2014-09-27 LAB — PREPARE RBC (CROSSMATCH)

## 2014-09-27 MED ORDER — SODIUM CHLORIDE 0.9 % IV SOLN
510.0000 mg | Freq: Once | INTRAVENOUS | Status: AC
  Administered 2014-09-27: 510 mg via INTRAVENOUS
  Filled 2014-09-27: qty 17

## 2014-09-27 MED ORDER — SODIUM CHLORIDE 0.9 % IV SOLN
Freq: Once | INTRAVENOUS | Status: AC
  Administered 2014-09-27: 10:00:00 via INTRAVENOUS

## 2014-09-27 NOTE — Telephone Encounter (Signed)
Received call from Crystal/Dr Dunham/[facility] stating that pt was scheduled for fereheme & aranesp at [facility] Stay today & per daughter, pt received fereheme at our office today.  She wants to know if Dr Burr Medico is going to handle his anemia & order additional fereheme & aranesp.  Discussed with Dr Burr Medico & she does not plan to give aranesp d/t to s.e. of tumor growth & she thought Dr Lorrene Reid was in agreement.  Suggested that she talk with Dr Lorrene Reid.  She did & called back & states pt can go home & appt at [facility] was cancelled by Crystal.  Pt to f/u with Dr Burr Medico in Feb.

## 2014-09-27 NOTE — Patient Instructions (Signed)

## 2014-09-29 ENCOUNTER — Encounter: Payer: Self-pay | Admitting: Hematology

## 2014-09-30 ENCOUNTER — Other Ambulatory Visit: Payer: Self-pay | Admitting: Nephrology

## 2014-09-30 DIAGNOSIS — N184 Chronic kidney disease, stage 4 (severe): Secondary | ICD-10-CM

## 2014-09-30 LAB — PROTEIN ELECTROPHORESIS, SERUM, WITH REFLEX
ALBUMIN ELP: 47.6 % — AB (ref 55.8–66.1)
ALPHA-1-GLOBULIN: 11.9 % — AB (ref 2.9–4.9)
Alpha-2-Globulin: 16.6 % — ABNORMAL HIGH (ref 7.1–11.8)
BETA 2: 7 % — AB (ref 3.2–6.5)
Beta Globulin: 6.6 % (ref 4.7–7.2)
Gamma Globulin: 10.3 % — ABNORMAL LOW (ref 11.1–18.8)
Total Protein, Serum Electrophoresis: 6.3 g/dL (ref 6.0–8.3)

## 2014-09-30 LAB — CEA: CEA: 222.9 ng/mL — ABNORMAL HIGH (ref 0.0–5.0)

## 2014-09-30 LAB — FOLATE RBC: RBC FOLATE: 1029 ng/mL (ref >280)

## 2014-09-30 LAB — VITAMIN B12: Vitamin B-12: >2000 pg/mL — ABNORMAL HIGH (ref 211–911)

## 2014-09-30 LAB — TYPE AND SCREEN
ABO/RH(D): A POS
Antibody Screen: NEGATIVE
Unit division: 0
Unit division: 0

## 2014-09-30 LAB — CANCER ANTIGEN 19-9: CA 19-9: 16750.1 U/mL — ABNORMAL HIGH (ref <35.0)

## 2014-09-30 LAB — AFP TUMOR MARKER-PREVIOUS METHOD: AFP TUMOR MARKER: 17.3 ng/mL — AB (ref 0.0–8.0)

## 2014-09-30 LAB — AFP TUMOR MARKER: AFP-Tumor Marker: 16.2 ng/mL — ABNORMAL HIGH (ref <6.1)

## 2014-09-30 LAB — ERYTHROPOIETIN: Erythropoietin: 35.4 m[IU]/mL — ABNORMAL HIGH (ref 2.6–18.5)

## 2014-10-03 ENCOUNTER — Ambulatory Visit
Admission: RE | Admit: 2014-10-03 | Discharge: 2014-10-03 | Disposition: A | Discharge status: Home / Self Care | Payer: Medicare Other | Source: Ambulatory Visit | Attending: Nephrology | Admitting: Nephrology

## 2014-10-03 DIAGNOSIS — N184 Chronic kidney disease, stage 4 (severe): Secondary | ICD-10-CM

## 2014-10-07 ENCOUNTER — Telehealth: Payer: Self-pay | Admitting: *Deleted

## 2014-10-07 NOTE — Telephone Encounter (Signed)
Pt's daughter called with concern due to " dad having a lot of new symptoms since he was last seen "  " He seems to be more declining ".  Not drinking or eating well.  Not able to ambulate well - very unsteady. Increased pain in back and knee and hip which is new.  Daughter is inquiring " should he be seen before next week or should we take him back to the hospital "  Return call [phone] to Santiago Glad.  THIS NOTE WILL BE SENT TO MD AND RN AT DESK FOR FOLLOW UP AND RETURN CALL TO PT.

## 2014-10-08 ENCOUNTER — Telehealth: Payer: Self-pay | Admitting: Hematology

## 2014-10-08 ENCOUNTER — Other Ambulatory Visit: Payer: Self-pay | Admitting: *Deleted

## 2014-10-08 ENCOUNTER — Ambulatory Visit (HOSPITAL_COMMUNITY)
Admission: RE | Admit: 2014-10-08 | Discharge: 2014-10-08 | Disposition: A | Discharge status: Home / Self Care | Payer: Medicare Other | Source: Ambulatory Visit | Attending: Hematology | Admitting: Hematology

## 2014-10-08 ENCOUNTER — Ambulatory Visit (HOSPITAL_BASED_OUTPATIENT_CLINIC_OR_DEPARTMENT_OTHER): Payer: Medicare Other

## 2014-10-08 ENCOUNTER — Ambulatory Visit (HOSPITAL_BASED_OUTPATIENT_CLINIC_OR_DEPARTMENT_OTHER): Payer: Medicare Other | Admitting: Nurse Practitioner

## 2014-10-08 ENCOUNTER — Telehealth: Payer: Self-pay | Admitting: *Deleted

## 2014-10-08 ENCOUNTER — Other Ambulatory Visit: Payer: Self-pay | Admitting: Hematology

## 2014-10-08 VITALS — BP 103/57 | HR 98 | Temp 96.9°F | Resp 18 | Ht 71.0 in | Wt 172.1 lb

## 2014-10-08 DIAGNOSIS — R918 Other nonspecific abnormal finding of lung field: Secondary | ICD-10-CM | POA: Diagnosis not present

## 2014-10-08 DIAGNOSIS — R630 Anorexia: Secondary | ICD-10-CM

## 2014-10-08 DIAGNOSIS — E8809 Other disorders of plasma-protein metabolism, not elsewhere classified: Secondary | ICD-10-CM

## 2014-10-08 DIAGNOSIS — R634 Abnormal weight loss: Secondary | ICD-10-CM

## 2014-10-08 DIAGNOSIS — J90 Pleural effusion, not elsewhere classified: Secondary | ICD-10-CM | POA: Diagnosis not present

## 2014-10-08 DIAGNOSIS — E860 Dehydration: Secondary | ICD-10-CM

## 2014-10-08 DIAGNOSIS — C221 Intrahepatic bile duct carcinoma: Secondary | ICD-10-CM

## 2014-10-08 DIAGNOSIS — G893 Neoplasm related pain (acute) (chronic): Secondary | ICD-10-CM

## 2014-10-08 DIAGNOSIS — C801 Malignant (primary) neoplasm, unspecified: Secondary | ICD-10-CM

## 2014-10-08 DIAGNOSIS — N184 Chronic kidney disease, stage 4 (severe): Secondary | ICD-10-CM

## 2014-10-08 LAB — CBC WITH DIFFERENTIAL/PLATELET
BASO%: 0.8 % (ref 0.0–2.0)
BASOS ABS: 0.1 10*3/uL (ref 0.0–0.1)
EOS ABS: 0.1 10*3/uL (ref 0.0–0.5)
EOS%: 0.5 % (ref 0.0–7.0)
HCT: 32.9 % — ABNORMAL LOW (ref 38.4–49.9)
HGB: 9.9 g/dL — ABNORMAL LOW (ref 13.0–17.1)
LYMPH#: 1.1 10*3/uL (ref 0.9–3.3)
LYMPH%: 6.8 % — ABNORMAL LOW (ref 14.0–49.0)
MCH: 31 pg (ref 27.2–33.4)
MCHC: 29.9 g/dL — ABNORMAL LOW (ref 32.0–36.0)
MCV: 103.6 fL — AB (ref 79.3–98.0)
MONO#: 1 10*3/uL — ABNORMAL HIGH (ref 0.1–0.9)
MONO%: 6.3 % (ref 0.0–14.0)
NEUT#: 13.5 10*3/uL — ABNORMAL HIGH (ref 1.5–6.5)
NEUT%: 85.6 % — ABNORMAL HIGH (ref 39.0–75.0)
Platelets: 438 10*3/uL — ABNORMAL HIGH (ref 140–400)
RBC: 3.18 10*6/uL — ABNORMAL LOW (ref 4.20–5.82)
RDW: 18.1 % — AB (ref 11.0–14.6)
WBC: 15.8 10*3/uL — AB (ref 4.0–10.3)

## 2014-10-08 LAB — COMPREHENSIVE METABOLIC PANEL (CC13)
ALBUMIN: 2.5 g/dL — AB (ref 3.5–5.0)
ALT: 103 U/L — ABNORMAL HIGH (ref 0–55)
AST: 93 U/L — ABNORMAL HIGH (ref 5–34)
Alkaline Phosphatase: 372 U/L — ABNORMAL HIGH (ref 40–150)
Anion Gap: 16 mEq/L — ABNORMAL HIGH (ref 3–11)
BILIRUBIN TOTAL: 0.85 mg/dL (ref 0.20–1.20)
BUN: 76.1 mg/dL — AB (ref 7.0–26.0)
CHLORIDE: 107 meq/L (ref 98–109)
CO2: 16 mEq/L — ABNORMAL LOW (ref 22–29)
CREATININE: 3.8 mg/dL — AB (ref 0.7–1.3)
Calcium: 9.7 mg/dL (ref 8.4–10.4)
EGFR: 14 mL/min/{1.73_m2} — AB (ref >90)
GLUCOSE: 172 mg/dL — AB (ref 70–140)
Potassium: 6 mEq/L — ABNORMAL HIGH (ref 3.5–5.1)
SODIUM: 139 meq/L (ref 136–145)
Total Protein: 6.3 g/dL — ABNORMAL LOW (ref 6.4–8.3)

## 2014-10-08 LAB — GLUCOSE, CAPILLARY: Glucose-Capillary: 170 mg/dL — ABNORMAL HIGH (ref 70–99)

## 2014-10-08 MED ORDER — SODIUM CHLORIDE 0.9 % IV SOLN
INTRAVENOUS | Status: AC
  Administered 2014-10-08: 13:00:00 via INTRAVENOUS

## 2014-10-08 MED ORDER — ONDANSETRON 8 MG/NS 50 ML IVPB
INTRAVENOUS | Status: AC
  Filled 2014-10-08: qty 8

## 2014-10-08 MED ORDER — MORPHINE SULFATE ER 15 MG PO TBCR: 15.0000 mg | EXTENDED_RELEASE_TABLET | Freq: Every day | ORAL | Status: DC

## 2014-10-08 MED ORDER — FLUDEOXYGLUCOSE F - 18 (FDG) INJECTION
9.0700 | Freq: Once | INTRAVENOUS | Status: AC | PRN
  Administered 2014-10-08: 9.07 via INTRAVENOUS

## 2014-10-08 MED ORDER — DEXAMETHASONE 4 MG PO TABS: 4.0000 mg | ORAL_TABLET | Freq: Every day | ORAL | Status: AC

## 2014-10-08 MED ORDER — ONDANSETRON 8 MG/50ML IVPB (CHCC)
8.0000 mg | Freq: Once | INTRAVENOUS | Status: AC
  Administered 2014-10-08: 8 mg via INTRAVENOUS

## 2014-10-08 NOTE — Telephone Encounter (Signed)
Apt with NP/CB Symptom Management added per 01/26 POF....., sent msg to [place] Advising of this add on.... KJ

## 2014-10-08 NOTE — Patient Instructions (Signed)
Dehydration, Adult Dehydration is when you lose more fluids from the body than you take in. Vital organs like the kidneys, brain, and heart cannot function without a proper amount of fluids and salt. Any loss of fluids from the body can cause dehydration.  CAUSES   Vomiting.  Diarrhea.  Excessive sweating.  Excessive urine output.  Fever. SYMPTOMS  Mild dehydration  Thirst.  Dry lips.  Slightly dry mouth. Moderate dehydration  Very dry mouth.  Sunken eyes.  Skin does not bounce back quickly when lightly pinched and released.  Dark urine and decreased urine production.  Decreased tear production.  Headache. Severe dehydration  Very dry mouth.  Extreme thirst.  Rapid, weak pulse (more than 100 beats per minute at rest).  Cold hands and feet.  Not able to sweat in spite of heat and temperature.  Rapid breathing.  Blue lips.  Confusion and lethargy.  Difficulty being awakened.  Minimal urine production.  No tears. DIAGNOSIS  Your caregiver will diagnose dehydration based on your symptoms and your exam. Blood and urine tests will help confirm the diagnosis. The diagnostic evaluation should also identify the cause of dehydration. TREATMENT  Treatment of mild or moderate dehydration can often be done at home by increasing the amount of fluids that you drink. It is best to drink small amounts of fluid more often. Drinking too much at one time can make vomiting worse. Refer to the home care instructions below. Severe dehydration needs to be treated at the hospital where you will probably be given intravenous (IV) fluids that contain water and electrolytes. HOME CARE INSTRUCTIONS   Ask your caregiver about specific rehydration instructions.  Drink enough fluids to keep your urine clear or pale yellow.  Drink small amounts frequently if you have nausea and vomiting.  Eat as you normally do.  Avoid:  Foods or drinks high in sugar.  Carbonated  drinks.  Juice.  Extremely hot or cold fluids.  Drinks with caffeine.  Fatty, greasy foods.  Alcohol.  Tobacco.  Overeating.  Gelatin desserts.  Wash your hands well to avoid spreading bacteria and viruses.  Only take over-the-counter or prescription medicines for pain, discomfort, or fever as directed by your caregiver.  Ask your caregiver if you should continue all prescribed and over-the-counter medicines.  Keep all follow-up appointments with your caregiver. SEEK MEDICAL CARE IF:  You have abdominal pain and it increases or stays in one area (localizes).  You have a rash, stiff neck, or severe headache.  You are irritable, sleepy, or difficult to awaken.  You are weak, dizzy, or extremely thirsty. SEEK IMMEDIATE MEDICAL CARE IF:   You are unable to keep fluids down or you get worse despite treatment.  You have frequent episodes of vomiting or diarrhea.  You have blood or green matter (bile) in your vomit.  You have blood in your stool or your stool looks black and tarry.  You have not urinated in 6 to 8 hours, or you have only urinated a small amount of very dark urine.  You have a fever.  You faint. MAKE SURE YOU:   Understand these instructions.  Will watch your condition.  Will get help right away if you are not doing well or get worse. Document Released: 08/30/2005 Document Revised: 11/22/2011 Document Reviewed: 04/19/2011 Exit[employer] Patient Information 2015 [employer]. This information is not intended to replace advice given to you by your health care provider. Make sure you discuss any questions you have with your health care   provider.  

## 2014-10-09 ENCOUNTER — Encounter: Payer: Self-pay | Admitting: Nurse Practitioner

## 2014-10-09 DIAGNOSIS — R630 Anorexia: Secondary | ICD-10-CM | POA: Insufficient documentation

## 2014-10-09 DIAGNOSIS — G893 Neoplasm related pain (acute) (chronic): Secondary | ICD-10-CM | POA: Insufficient documentation

## 2014-10-09 DIAGNOSIS — E8809 Other disorders of plasma-protein metabolism, not elsewhere classified: Secondary | ICD-10-CM | POA: Insufficient documentation

## 2014-10-09 NOTE — Assessment & Plan Note (Signed)
C/o worsening generalized pain.  Has been taking oxycodone with minimal effectiveness.  Prescribed MS Contin 15 mg QD; and advised pt continue with oxycodone for breakthru pain.

## 2014-10-09 NOTE — Assessment & Plan Note (Signed)
Newly dx'd liver carcinoma.  Review of recent staging scans with both patient and family today. Due to patient's age, comorbidities, and probable intolerance of chemotherapy- decision was made for Hospice care.  Will place Hospice referral today.

## 2014-10-09 NOTE — Assessment & Plan Note (Signed)
Hypoalbuminemia secondary to poor nutrition.  Advised that pt push protein if possible. Will order a nutrition consult as well.

## 2014-10-09 NOTE — Progress Notes (Signed)
SYMPTOM MANAGEMENT CLINIC   HPI: Eduardo Hubbard 81 y.o. male newly diagnosed with liver carcinoma. Undergoing no treatment at present.  Patient returns to the [facility] today for followup regarding recent staging scans.  Patient is complaining of minimal appetite and poor oral intake.  Patient  With continued weight loss as well.  Patient is also complaining of chronic, generalized pain; and states that taking oxycodone has only been minimally effective.  Patient states he feels fairly dehydrated today; in his requested IV fluid rehydration.  He also complains of some chronic, intermittent nausea; but no vomiting.  He denies any recent fevers or chills.  HPI  CURRENT THERAPY: No active treatment plan for patient.   ROS  Past Medical History  Diagnosis Date  . Type II or unspecified type diabetes mellitus without mention of complication, not stated as uncontrolled   . Hypertension   . Hyperlipemia   . Prostatitis   . PVC (premature ventricular contraction)   . Unspecified gastritis and gastroduodenitis without mention of hemorrhage   . Anemia   . Diverticulosis of colon (without mention of hemorrhage)   . Internal hemorrhoids without mention of complication   . Hepatitis A 1953  . Gout   . Hypogonadism male   . Aortic stenosis   . Esophageal reflux   . Renal insufficiency   . Degenerative arthritis of left knee   . Elevated prostate specific antigen (PSA)   . Other symptoms involving cardiovascular system   . Cardiac arrhythmia due to congenital heart disease   . Arthritis     Left Knee  . Stroke 2009  . Cataract     BILATERAL REMOVED  . TIA (transient ischemic attack)   . Elevated LFTs   . Chronic renal failure   . GERD (gastroesophageal reflux disease)   . Tubular adenoma of colon   . Cancer     Past Surgical History  Procedure Laterality Date  . Inguinal hernia repair      bilateral  . Cataract extraction w/ intraocular lens  implant, bilateral  Bilateral 2011, 2012  . Colonoscopy    . Carotid doppler  05/11/2012    Doppler velocities suggest less than 40% stenosis of the bilateral proximal interal carotid arteries  . Cardiovascular stress test  12/22/2012    Possible mid to basal inferior infarction, but no evidence of significant ischemia  . Transthoracic echocardiogram  12/22/2012    EF not noted, otherwise normal-mild  . Anterior fusion lumbar spine      has Esophageal reflux; History of CVA (cerebrovascular accident); HTN (hypertension); Aortic valve stenosis, mild; Dyslipidemia; Abnormal findings on radiological examination of gastrointestinal tract; Abnormal LFTs; Loss of weight; DM (diabetes mellitus) type II controlled with renal manifestation; Carcinoma; Chronic kidney disease (CKD), stage IV (severe); Anemia; Essential hypertension; Dehydration; Anorexia; Cancer associated pain; Hyperphosphatemia; and Hypoalbuminemia on his problem list.    is allergic to ciprofloxacin and contrast media.    Medication List       This list is accurate as of: 10/08/14 11:59 PM.  Always use your most recent med list.               acetaminophen 325 MG tablet  Commonly known as:  TYLENOL  Take 1 tablet (325 mg total) by mouth every 6 (six) hours as needed for mild pain or fever.     AGGRENOX 200-25 MG per 12 hr capsule  Generic drug:  dipyridamole-aspirin  Take 1 capsule by mouth 2 (two) times  daily.     aspirin EC 81 MG tablet  Take 81 mg by mouth every evening.     cholecalciferol 1000 UNITS tablet  Commonly known as:  VITAMIN D  Take 1,000 Units by mouth daily.     dexamethasone 4 MG tablet  Commonly known as:  DECADRON  Take 1 tablet (4 mg total) by mouth daily.     metoprolol succinate 25 MG 24 hr tablet  Commonly known as:  TOPROL-XL  Take 1 tablet (25 mg total) by mouth daily.     mirtazapine 7.5 MG tablet  Commonly known as:  REMERON  Take 1 tablet (7.5 mg total) by mouth at bedtime.     morphine 15 MG 12 hr  tablet  Commonly known as:  MS CONTIN  Take 1 tablet (15 mg total) by mouth daily.     omeprazole 20 MG tablet  Commonly known as:  PRILOSEC OTC  Take 20 mg by mouth daily.     oxyCODONE 5 MG immediate release tablet  Commonly known as:  Oxy IR/ROXICODONE  Take 5-10 mg by mouth every 4 (four) hours as needed for severe pain.     PRESERVISION AREDS PO  Take 1 tablet by mouth 2 (two) times daily.         PHYSICAL EXAMINATION  Blood pressure 103/57, pulse 98, temperature 96.9 F (36.1 C), temperature source Oral, resp. rate 18, height _0  (1.803 m), weight 172 lb 1.6 oz (78.064 kg).  Physical Exam  Constitutional: He is oriented to person, place, and time. Vital signs are normal. He appears malnourished and dehydrated. He appears unhealthy. He appears cachectic.  Pt appears weak, frail, and fatigued.  He  HENT:  Head: Normocephalic and atraumatic.  Mouth/Throat: Oropharynx is clear and moist.  Eyes: Conjunctivae and EOM are normal. Pupils are equal, round, and reactive to light. Right eye exhibits no discharge. Left eye exhibits no discharge. No scleral icterus.  Neck: Normal range of motion. Neck supple. No JVD present. No tracheal deviation present. No thyromegaly present.  Cardiovascular: Normal rate, regular rhythm, normal heart sounds and intact distal pulses.   Pulmonary/Chest: Effort normal and breath sounds normal. No respiratory distress. He has no wheezes. He has no rales. He exhibits no tenderness.  Abdominal: Soft. Bowel sounds are normal. He exhibits no distension and no mass. There is no tenderness. There is no rebound and no guarding.  Musculoskeletal: Normal range of motion. He exhibits no edema or tenderness.  Lymphadenopathy:    He has no cervical adenopathy.  Neurological: He is alert and oriented to person, place, and time. Gait normal.  Skin: Skin is warm and dry. No rash noted. No erythema. There is pallor.  Psychiatric: Affect normal.  Nursing note and  vitals reviewed.   LABORATORY DATA:. Appointment on 10/08/2014  Component Date Value Ref Range Status  . WBC 10/08/2014 15.8* 4.0 - 10.3 10e3/uL Final  . NEUT# 10/08/2014 13.5* 1.5 - 6.5 10e3/uL Final  . HGB 10/08/2014 9.9* 13.0 - 17.1 g/dL Final  . HCT 10/08/2014 32.9* 38.4 - 49.9 % Final  . Platelets 10/08/2014 438* 140 - 400 10e3/uL Final  . MCV 10/08/2014 103.6* 79.3 - 98.0 fL Final  . MCH 10/08/2014 31.0  27.2 - 33.4 pg Final  . MCHC 10/08/2014 29.9* 32.0 - 36.0 g/dL Final  . RBC 10/08/2014 3.18* 4.20 - 5.82 10e6/uL Final  . RDW 10/08/2014 18.1* 11.0 - 14.6 % Final  . lymph# 10/08/2014 1.1  0.9 - 3.3 10e3/uL  Final  . MONO# 10/08/2014 1.0* 0.1 - 0.9 10e3/uL Final  . Eosinophils Absolute 10/08/2014 0.1  0.0 - 0.5 10e3/uL Final  . Basophils Absolute 10/08/2014 0.1  0.0 - 0.1 10e3/uL Final  . NEUT% 10/08/2014 85.6* 39.0 - 75.0 % Final  . LYMPH% 10/08/2014 6.8* 14.0 - 49.0 % Final  . MONO% 10/08/2014 6.3  0.0 - 14.0 % Final  . EOS% 10/08/2014 0.5  0.0 - 7.0 % Final  . BASO% 10/08/2014 0.8  0.0 - 2.0 % Final  . Sodium 10/08/2014 139  136 - 145 mEq/L Final  . Potassium 10/08/2014 6.0* 3.5 - 5.1 mEq/L Final  . Chloride 10/08/2014 107  98 - 109 mEq/L Final  . CO2 10/08/2014 16* 22 - 29 mEq/L Final  . Glucose 10/08/2014 172* 70 - 140 mg/dl Final  . BUN 10/08/2014 76.1* 7.0 - 26.0 mg/dL Final  . Creatinine 10/08/2014 3.8* 0.7 - 1.3 mg/dL Final  . Total Bilirubin 10/08/2014 0.85  0.20 - 1.20 mg/dL Final  . Alkaline Phosphatase 10/08/2014 372* 40 - 150 U/L Final  . AST 10/08/2014 93* 5 - 34 U/L Final  . ALT 10/08/2014 103* 0 - 55 U/L Final  . Total Protein 10/08/2014 6.3* 6.4 - 8.3 g/dL Final  . Albumin 10/08/2014 2.5* 3.5 - 5.0 g/dL Final  . Calcium 10/08/2014 9.7  8.4 - 10.4 mg/dL Final  . Anion Gap 10/08/2014 16* 3 - 11 mEq/L Final  . EGFR 10/08/2014 14* >90 ml/min/1.73 m2 Final   eGFR is calculated using the CKD-EPI Creatinine Equation (2009)  Hospital Outpatient Visit on  10/08/2014  Component Date Value Ref Range Status  . Glucose-Capillary 10/08/2014 170* 70 - 99 mg/dL Final     RADIOGRAPHIC STUDIES: Nm Pet Image Initial (pi) Skull Base To Thigh  10/08/2014   CLINICAL DATA:  Initial treatment strategy for cholangiocarcinoma.  EXAM: NUCLEAR MEDICINE PET SKULL BASE TO THIGH  TECHNIQUE: 9.1 mCi F-18 FDG was injected intravenously. Full-ring PET imaging was performed from the skull base to thigh after the radiotracer. CT data was obtained and used for attenuation correction and anatomic localization.  FASTING BLOOD GLUCOSE:  Value: 170 mg/dl  COMPARISON:  Outside MRI 08/30/2014, CT 08/29/2014  FINDINGS: NECK  No hypermetabolic lymph nodes in the neck.  CHEST  There are bilateral pulmonary nodules. 7 mm right upper lobe pulmonary nodule (image 69, series 4) has mild associated metabolic activity. 7 mm left lower lobe nodule on image 81 also has associated metabolic activity. Irregular nodule measuring 9 mm in the left lung apex has associated metabolic activity. There are approximately 20 nodules scattered throughout both lungs. Small right effusion noted. No hypermetabolic nodes.  ABDOMEN/PELVIS  There is diffuse abnormal hypermetabolic activity involving the entirety of the left lateral hepatic and a large portion of the central left hepatic lobe as well as upper right hepatic lobes (segment 7 and 8). There is a more focal lesion within the right lobe in segment 8 with SUV max 8.3. More diffuse activity in the left lateral hepatic lobe has SUV max 5.8. Medial lead normal liver appears to be in the right hepatic lobe segments 5 and 6.  Mild activity associated with the left adrenal gland which is not nodular. Activity is similar to the normal liver activity felt to represent hyperplasia. No hypermetabolic abdominal pelvic lymph nodes. Smaller free fluid along the right pericolic gutter. Smaller free fluid along the left pericolic gutter additionally. No clear evidence skeletal  metastasis. Percent heterogeneous marrow activity.  SKELETON  No clear metabolic activity. Heterogeneous marrow activity may relate to anemia.  IMPRESSION: 1. Diffuse abnormal metabolic activity within the right and left hepatic lobe is concerning for widespread infiltrative carcinoma. 2. Focal areas of the tumor noted in the right hepatic lobe central left hepatic lobe. 3. Multiple bilateral pulmonary nodules with associated metabolic activity are most consistent with pulmonary metastasis. 4. Mild activity associated adrenal glands is felt to represent hyperplasia. Recommend attention on follow-up. 5. Small right pleural effusion and moderate volume of intraperitoneal free fluid. Intraperitoneal free fluid may relate to hepatic dysfunction.   Electronically Signed   By: Suzy Bouchard M.D.   On: 10/08/2014 10:15    ASSESSMENT/PLAN:    Anorexia C/o minimal appetite; and continued loss of weight. Advised that pt try eating multiple small meals throughout the day. Prescribed Dexamethasone 4 mg every morning to try for total of 10 days to try as appetite stimulant.  Also will order a nutrition consult for the patient.    Cancer associated pain C/o worsening generalized pain.  Has been taking oxycodone with minimal effectiveness.  Prescribed MS Contin 15 mg QD; and advised pt continue with oxycodone for breakthru pain.    Carcinoma Newly dx'd liver carcinoma.  Review of recent staging scans with both patient and family today. Due to patient's age, comorbidities, and probable intolerance of chemotherapy- decision was made for Hospice care.  Will place Hospice referral today.     Chronic kidney disease (CKD), stage IV (severe) Hx of chronic renal insufficiency; with increased creatinine to 3.8.  Most likely increased creatinine elevation due to dehydration.    Dehydration Pt with poor appetite and little oral intake. Patient appears dehydrated; and will receive 1 liter NS IV fluid rehydration  today. Was also encouraged to push fluids if possible.    Loss of weight Due to lack of appetite most likely secondary to cancer dx.  Pt encouraged to eat mulitple small meals throughout the day.  Will order a nutrition consult as well.    Hyperphosphatemia Alk phos elevated to 372.  Most likely secondary to newly dx'd liver cancer.    Hypoalbuminemia Hypoalbuminemia secondary to poor nutrition.  Advised that pt push protein if possible. Will order a nutrition consult as well.    Patient stated understanding of all instructions; and was in agreement with this plan of care. The patient knows to call the clinic with any problems, questions or concerns.   This was a shared visit with Dr. Burr Medico today.   Total time spent with patient was 40 minutes;  with greater than 75 percent of that time spent in face to face counseling regarding his symptoms and coordination of care and follow up.  Disclaimer: This note was dictated with voice recognition software. Similar sounding words can inadvertently be transcribed and may not be corrected upon review.   Drue Second, NP 10/09/2014

## 2014-10-09 NOTE — Assessment & Plan Note (Signed)
Alk phos elevated to 372.  Most likely secondary to newly dx'd liver cancer.

## 2014-10-09 NOTE — Assessment & Plan Note (Signed)
Hx of chronic renal insufficiency; with increased creatinine to 3.8.  Most likely increased creatinine elevation due to dehydration.

## 2014-10-09 NOTE — Assessment & Plan Note (Signed)
Pt with poor appetite and little oral intake. Patient appears dehydrated; and will receive 1 liter NS IV fluid rehydration today. Was also encouraged to push fluids if possible.

## 2014-10-09 NOTE — Assessment & Plan Note (Addendum)
C/o minimal appetite; and continued loss of weight. Advised that pt try eating multiple small meals throughout the day. Prescribed Dexamethasone 4 mg every morning to try for total of 10 days to try as appetite stimulant.  Also will order a nutrition consult for the patient.

## 2014-10-09 NOTE — Assessment & Plan Note (Signed)
Due to lack of appetite most likely secondary to cancer dx.  Pt encouraged to eat mulitple small meals throughout the day.  Will order a nutrition consult as well.

## 2014-10-10 NOTE — Telephone Encounter (Signed)
Pt was seen by MD.

## 2014-10-11 ENCOUNTER — Telehealth: Payer: Self-pay | Admitting: *Deleted

## 2014-10-11 NOTE — Telephone Encounter (Signed)
1)DISCONTINUE MEDICATIONS THAT PT. DOES NOT NEED. 2)WHY IS PT. ON AGGRENOX AND ASPIRIN?

## 2014-10-12 NOTE — Telephone Encounter (Signed)
OK to discontinue these meds when he is on hospice.   Truitt Merle

## 2014-10-12 NOTE — Progress Notes (Signed)
Attending addendum  I have seen the patient, examined him. I agree with the assessment and and plan and have edited the notes.   I reviewed his PET scan results with patient and his son-in-law. Unfortunately, the scan showed widespread infiltrative carcinoma in both left and right lobes of liver, and multiple hypermetabolic lung nodules consistent with metastasis. His performance status has really deteriorated in the past week. I discussed this is incurable disease, and overall goal prognosis is extremely poor. Given his advanced age and multiple comorbidities, especially renal failure, I do not think he is a candidate for palliative chemotherapy. So I recommend active care with hospice. I spoke with patient's daughter about the overall poor prognosis today before and she agrees with hospice. Patient and his son-in-law are also in agreement with hospice care at home. I made a referral to the [facility] pad if care and hospice program today. I'll remain to be his M.D., and I'll see him only as needed in the future.  Truitt Merle  10/08/2014

## 2014-10-14 ENCOUNTER — Other Ambulatory Visit: Payer: Self-pay | Admitting: *Deleted

## 2014-10-14 ENCOUNTER — Ambulatory Visit: Payer: Medicare Other | Admitting: Hematology

## 2014-10-14 ENCOUNTER — Other Ambulatory Visit: Payer: Medicare Other

## 2014-10-14 DIAGNOSIS — C221 Intrahepatic bile duct carcinoma: Secondary | ICD-10-CM

## 2014-10-14 MED ORDER — OXYCODONE HCL ER 10 MG PO T12A: 10.0000 mg | EXTENDED_RELEASE_TABLET | Freq: Two times a day (BID) | ORAL | Status: AC

## 2014-10-14 NOTE — Telephone Encounter (Addendum)
PT. CONTINUED TO DECLINE OVER THE WEEKEND. NEW MEDICATIONS FOR PT. ATIVAN 0.5MG  SL EVERY FOUR HOURS PRN, OXYCONTIN 10MG  EVERY 12 HOURS, AND DULCOLAX SUPPOSITORY PRN

## 2014-11-04 ENCOUNTER — Telehealth: Payer: Self-pay | Admitting: Hematology and Oncology

## 2014-11-04 NOTE — Telephone Encounter (Signed)
Received death certificate 11/04/14 °

## 2014-11-12 DEATH — deceased

## 2014-12-09 ENCOUNTER — Encounter (INDEPENDENT_AMBULATORY_CARE_PROVIDER_SITE_OTHER): Payer: Self-pay | Admitting: *Deleted

## 2014-12-09 DIAGNOSIS — B351 Tinea unguium: Secondary | ICD-10-CM

## 2014-12-09 DIAGNOSIS — M79673 Pain in unspecified foot: Secondary | ICD-10-CM

## 2014-12-09 NOTE — Patient Instructions (Signed)
Diabetes and Foot Care Diabetes may cause you to have problems because of poor blood supply (circulation) to your feet and legs. This may cause the skin on your feet to become thinner, break easier, and heal more slowly. Your skin may become dry, and the skin may peel and crack. You may also have nerve damage in your legs and feet causing decreased feeling in them. You may not notice minor injuries to your feet that could lead to infections or more serious problems. Taking care of your feet is one of the most important things you can do for yourself.  HOME CARE INSTRUCTIONS  Wear shoes at all times, even in the house. Do not go barefoot. Bare feet are easily injured.  Check your feet daily for blisters, cuts, and redness. If you cannot see the bottom of your feet, use a mirror or ask someone for help.  Wash your feet with warm water (do not use hot water) and mild soap. Then pat your feet and the areas between your toes until they are completely dry. Do not soak your feet as this can dry your skin.  Apply a moisturizing lotion or petroleum jelly (that does not contain alcohol and is unscented) to the skin on your feet and to dry, brittle toenails. Do not apply lotion between your toes.  Trim your toenails straight across. Do not dig under them or around the cuticle. File the edges of your nails with an emery board or nail file.  Do not cut corns or calluses or try to remove them with medicine.  Wear clean socks or stockings every day. Make sure they are not too tight. Do not wear knee-high stockings since they may decrease blood flow to your legs.  Wear shoes that fit properly and have enough cushioning. To break in new shoes, wear them for just a few hours a day. This prevents you from injuring your feet. Always look in your shoes before you put them on to be sure there are no objects inside.  Do not cross your legs. This may decrease the blood flow to your feet.  If you find a minor scrape,  cut, or break in the skin on your feet, keep it and the skin around it clean and dry. These areas may be cleansed with mild soap and water. Do not cleanse the area with peroxide, alcohol, or iodine.  When you remove an adhesive bandage, be sure not to damage the skin around it.  If you have a wound, look at it several times a day to make sure it is healing.  Do not use heating pads or hot water bottles. They may burn your skin. If you have lost feeling in your feet or legs, you may not know it is happening until it is too late.  Make sure your health care provider performs a complete foot exam at least annually or more often if you have foot problems. Report any cuts, sores, or bruises to your health care provider immediately. SEEK MEDICAL CARE IF:   You have an injury that is not healing.  You have cuts or breaks in the skin.  You have an ingrown nail.  You notice redness on your legs or feet.  You feel burning or tingling in your legs or feet.  You have pain or cramps in your legs and feet.  Your legs or feet are numb.  Your feet always feel cold. SEEK IMMEDIATE MEDICAL CARE IF:   There is increasing redness,   swelling, or pain in or around a wound.  There is a red line that goes up your leg.  Pus is coming from a wound.  You develop a fever or as directed by your health care provider.  You notice a bad smell coming from an ulcer or wound. Document Released: 08/27/2000 Document Revised: 05/02/2013 Document Reviewed: 02/06/2013 Exit[employer] Patient Information 2015 [employer]. This information is not intended to replace advice given to you by your health care provider. Make sure you discuss any questions you have with your health care provider.  

## 2014-12-09 NOTE — Progress Notes (Signed)
Subjective:     Patient ID: Eduardo Hubbard, male   DOB: 04-Oct-1926, 81 y.o.   MRN: [id number]  HPI patient presents with nail disease 1-5 both feet that he cannot cut and they become painful and he is a long-term diabetic   Review of Systems     Objective:   Physical Exam Neurovascular status unchanged with thick yellow brittle nailbeds 1-5 both feet    Assessment:     Mycotic nail infection with pain 1-5 both feet    Plan:     Debris painful nailbeds 1-5 both feet with no iatrogenic bleeding noted      This encounter was created in error - please disregard.

## 2015-04-21 IMAGING — PT NM PET TUM IMG INITIAL (PI) SKULL BASE T - THIGH
5 series · 25 of 25 positions shown · non-contrast
Comparison: Outside MRI 08/30/2014, CT 08/29/2014

CLINICAL DATA: Initial treatment strategy for cholangiocarcinoma.

EXAM:
NUCLEAR MEDICINE PET SKULL BASE TO THIGH
TECHNIQUE: 9.1 mCi F-18 FDG was injected intravenously. Full-ring PET imaging
was performed from the skull base to thigh after the radiotracer. CT
data was obtained and used for attenuation correction and anatomic
localization.
FASTING BLOOD GLUCOSE:  Value: 170 mg/dl

[Series 3: pet sk_thigh ac · axial · 5.0mm · 4.07mm/px · z∈[-996,-76]mm · 8 of 231 slices shown]
[im 1/231]
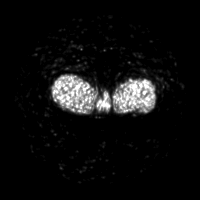
[im 33/231]
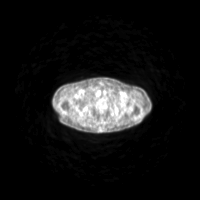
[im 66/231]
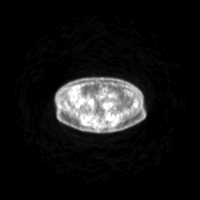
[im 99/231]
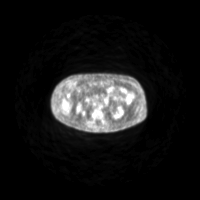
[im 132/231]
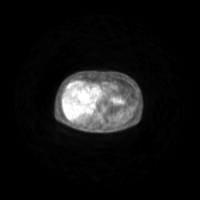
[im 165/231]
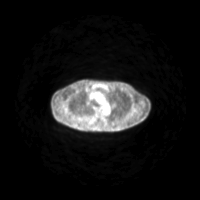
[im 198/231]
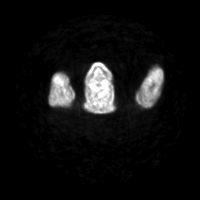
[im 231/231]
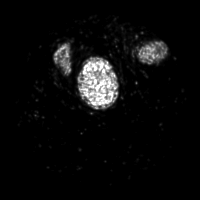

[Series 4: ct sk_thigh 5.0 b31f · axial · 5.0mm · 0.86mm/px · z∈[-996,-76]mm · 7 of 231 slices shown]
[im 1/231]
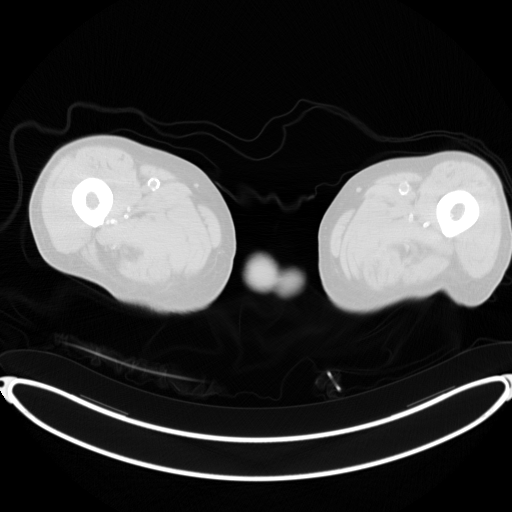
[im 39/231]
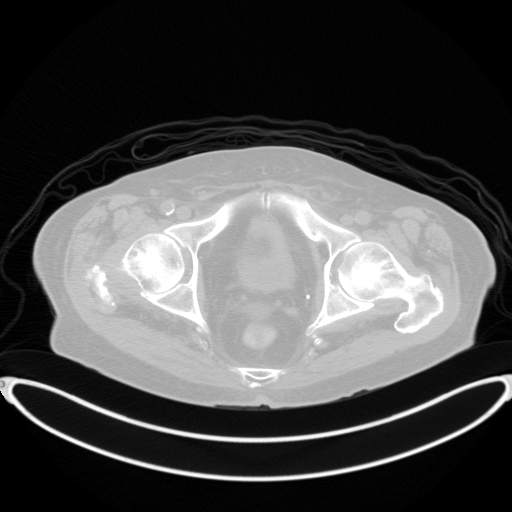
[im 77/231]
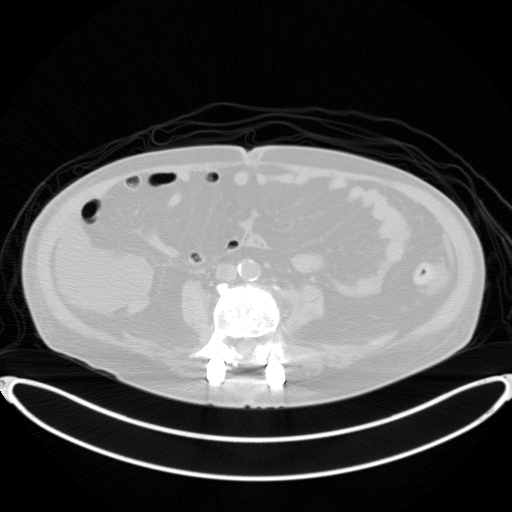
[im 116/231]
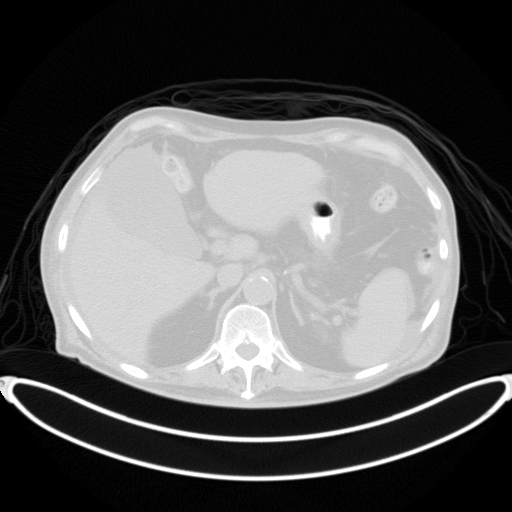
[im 154/231]
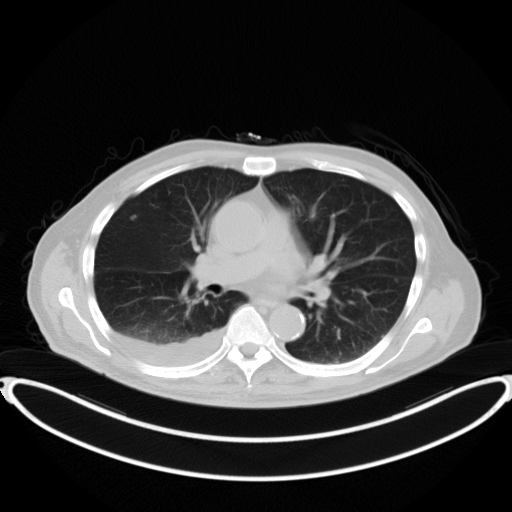
[im 192/231  brain]
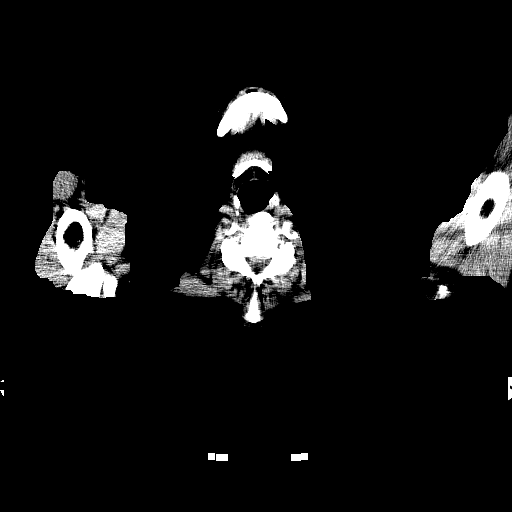
[im 231/231  brain]
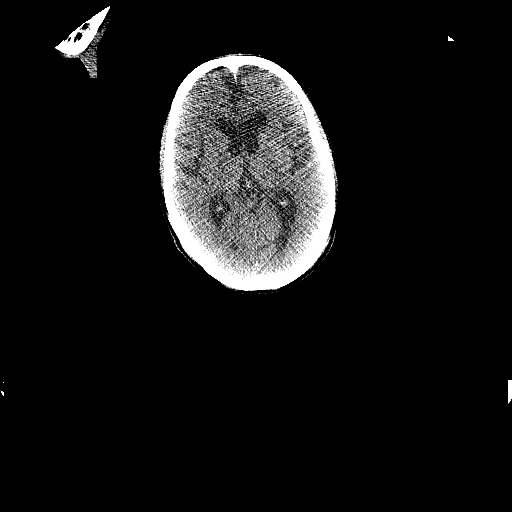

[Series 7: pet sk_thigh nac · axial · 5.0mm · 4.07mm/px · z∈[-996,-76]mm · 7 of 231 slices shown]
[im 1/231]
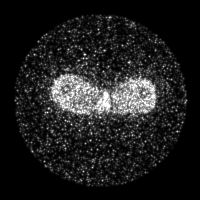
[im 39/231]
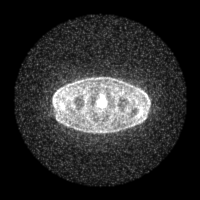
[im 77/231]
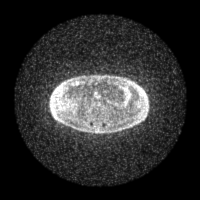
[im 116/231]
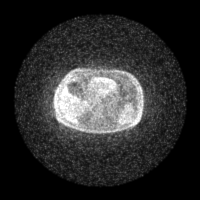
[im 154/231]
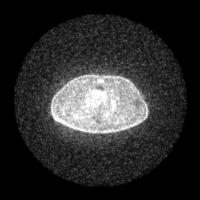
[im 192/231]
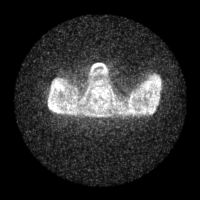
[im 231/231]
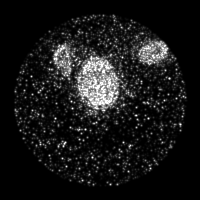

[Series 8: ct sk_thigh 5.0 b70f (id)_bone · axial · 5.0mm · 0.67mm/px · z∈[-476,-256]mm · 2 of 56 slices shown]
[im 1/56  bone]
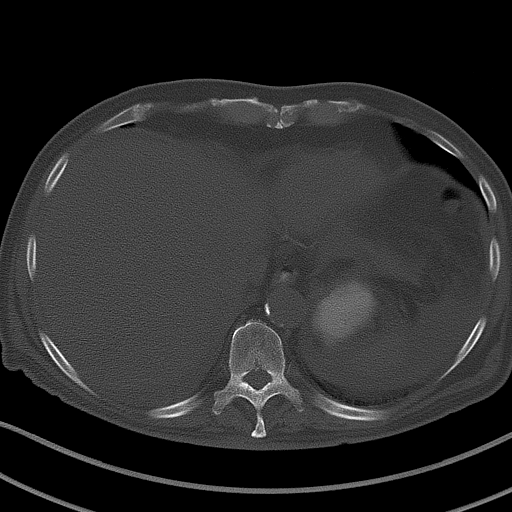
[im 56/56  bone]
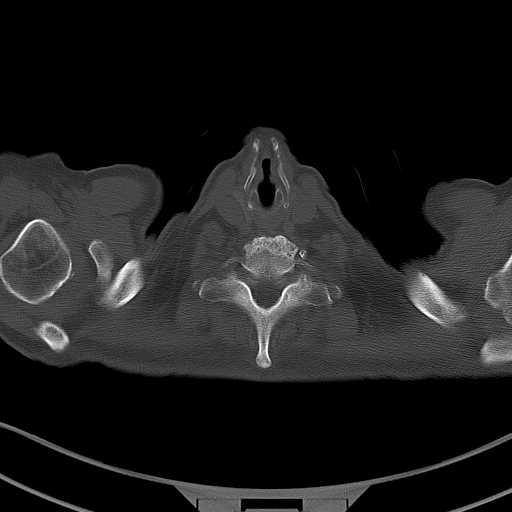

[Series 604: mip collection<mip range> · coronal · 1.91mm/px · 1 of 32 slices shown]
[im 1/32]
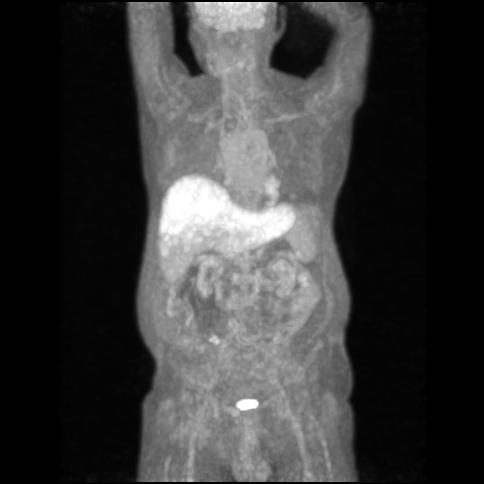

[25 of 25 positions shown; findings below may reference images not displayed]

FINDINGS: NECK

No hypermetabolic lymph nodes in the neck.

CHEST

There are bilateral pulmonary nodules. 7 mm right upper lobe
pulmonary nodule (image 69, series 4) has mild associated metabolic
activity. 7 mm left lower lobe nodule on image 81 also has
associated metabolic activity. Irregular nodule measuring 9 mm in
the left lung apex has associated metabolic activity. There are
approximately 20 nodules scattered throughout both lungs. Small
right effusion noted. No hypermetabolic nodes.

ABDOMEN/PELVIS

There is diffuse abnormal hypermetabolic activity involving the
entirety of the left lateral hepatic and a large portion of the
central left hepatic lobe as well as upper right hepatic lobes
(segment 7 and 8). There is a more focal lesion within the right
lobe in segment 8 with SUV max 8.3. More diffuse activity in the
left lateral hepatic lobe has SUV max 5.8. Medial lead normal liver
appears to be in the right hepatic lobe segments 5 and 6.

Mild activity associated with the left adrenal gland which is not
nodular. Activity is similar to the normal liver activity felt to
represent hyperplasia. No hypermetabolic abdominal pelvic lymph
nodes. Smaller free fluid along the right pericolic gutter. Smaller
free fluid along the left pericolic gutter additionally. No clear
evidence skeletal metastasis. Percent heterogeneous marrow activity.

SKELETON

No clear metabolic activity. Heterogeneous marrow activity may
relate to anemia.
IMPRESSION: 1. Diffuse abnormal metabolic activity within the right and left
hepatic lobe is concerning for widespread infiltrative carcinoma.
2. Focal areas of the tumor noted in the right hepatic lobe central
left hepatic lobe.
3. Multiple bilateral pulmonary nodules with associated metabolic
activity are most consistent with pulmonary metastasis.
4. Mild activity associated adrenal glands is felt to represent
hyperplasia. Recommend attention on follow-up.
5. Small right pleural effusion and moderate volume of
intraperitoneal free fluid. Intraperitoneal free fluid may relate to
hepatic dysfunction.
